# Patient Record
Sex: Male | Born: 1985 | Race: Black or African American | Hispanic: No | Marital: Single | Smoking: Former smoker
Health system: Southern US, Community
[De-identification: ages and names within clinical notes are randomized; demographics above are authoritative.]

## PROBLEM LIST (undated history)

## (undated) DIAGNOSIS — B20 Human immunodeficiency virus [HIV] disease: Secondary | ICD-10-CM

## (undated) DIAGNOSIS — H547 Unspecified visual loss: Secondary | ICD-10-CM

## (undated) DIAGNOSIS — L853 Xerosis cutis: Secondary | ICD-10-CM

## (undated) DIAGNOSIS — I10 Essential (primary) hypertension: Secondary | ICD-10-CM

## (undated) HISTORY — DX: Essential (primary) hypertension: I10

## (undated) HISTORY — DX: Human immunodeficiency virus (HIV) disease: B20

## (undated) HISTORY — DX: Xerosis cutis: L85.3

## (undated) HISTORY — DX: Unspecified visual loss: H54.7

---

## 2005-03-27 ENCOUNTER — Emergency Department (HOSPITAL_COMMUNITY): Admission: EM | Admit: 2005-03-27 | Discharge: 2005-03-27 | Payer: Self-pay | Admitting: Emergency Medicine

## 2007-02-23 IMAGING — CR DG FOOT COMPLETE 3+V*L*
3 series · 3 of 3 positions shown · non-contrast
Comparison: none

CLINICAL DATA: Stepped on Quintin Villacorta

[place] FOOT - 3  VIEW:

[t foot ap left]
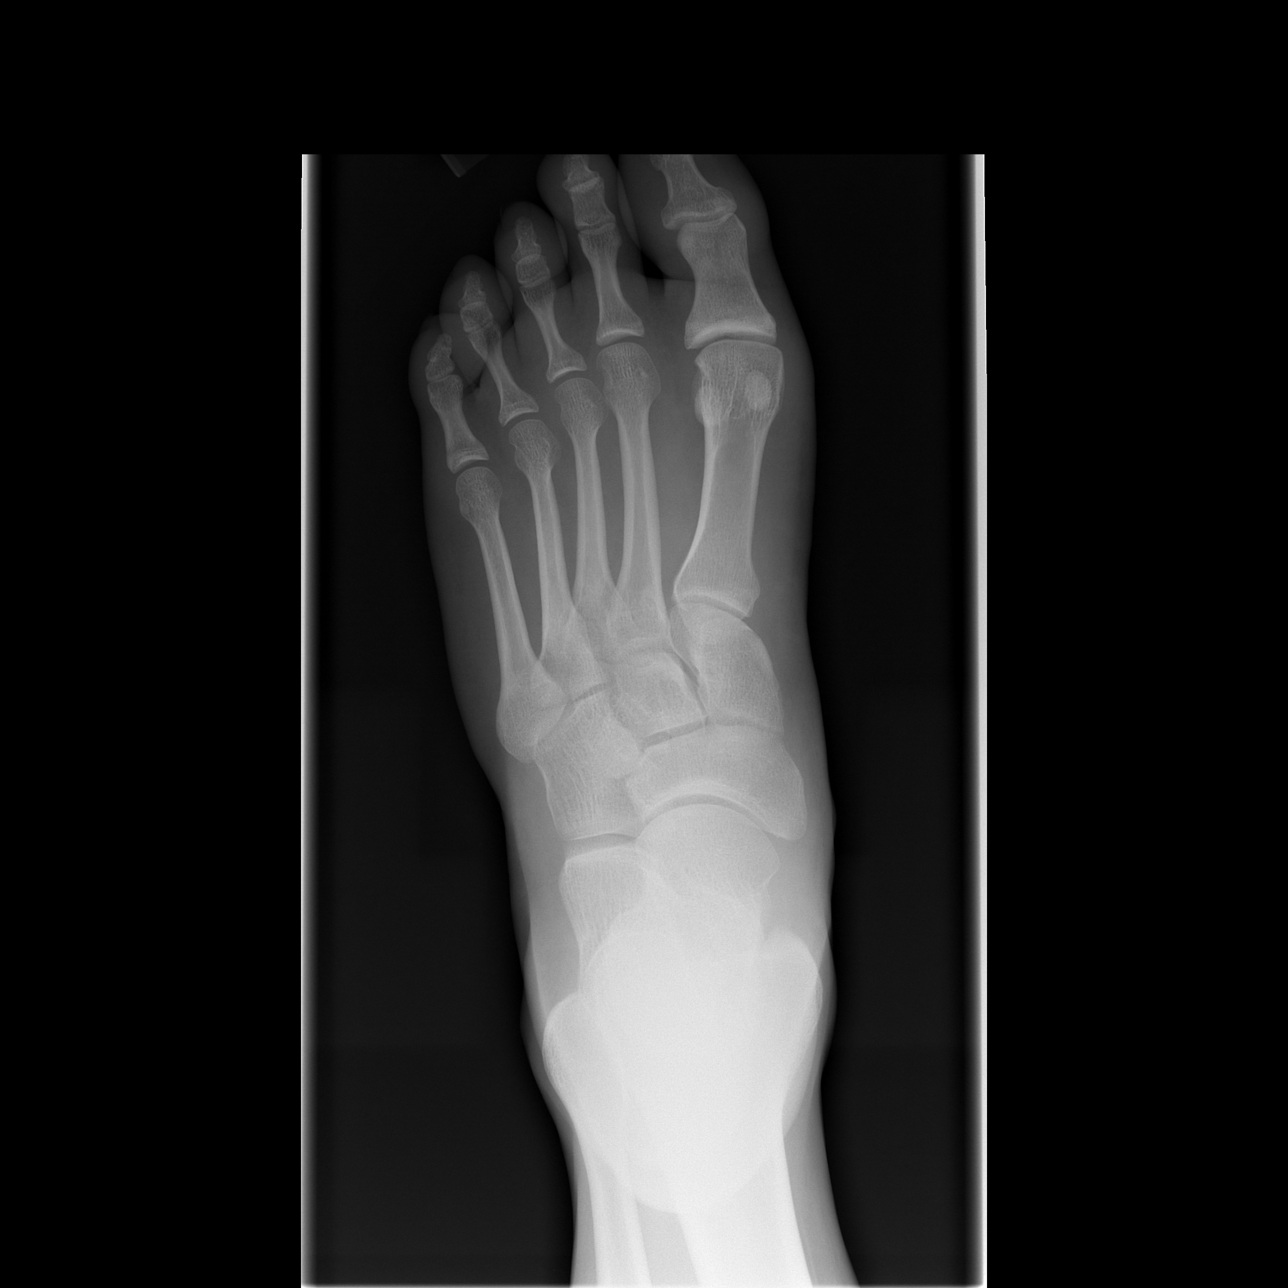

[t foot oblique left]
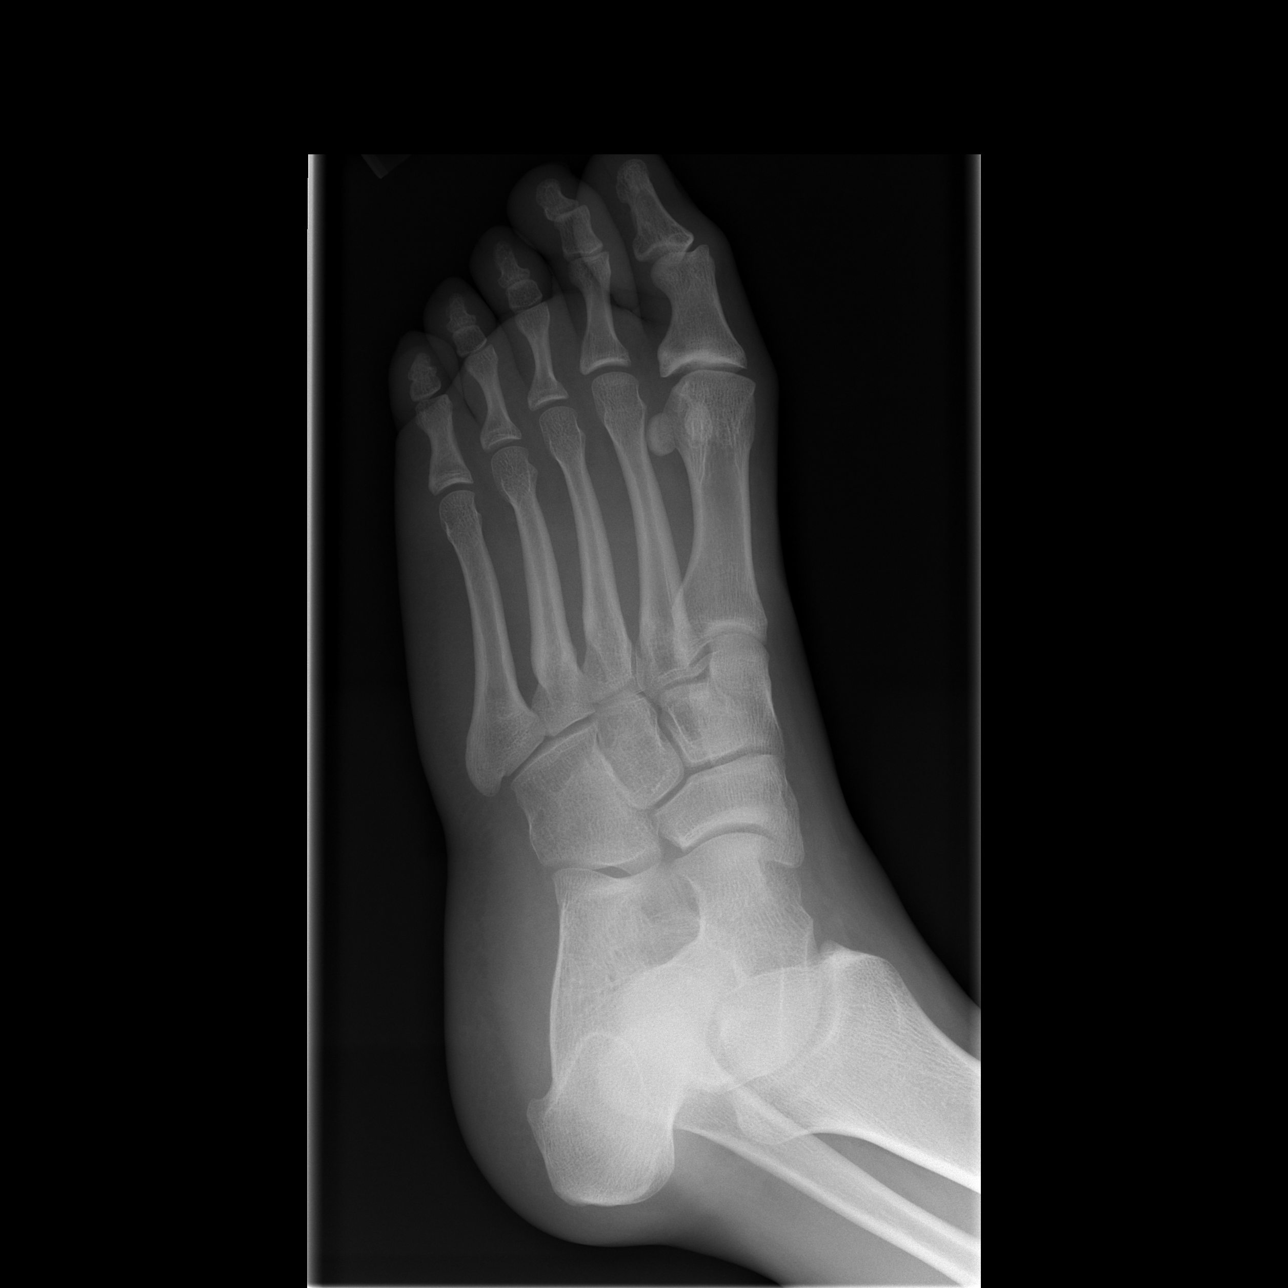

[t foot lat left]
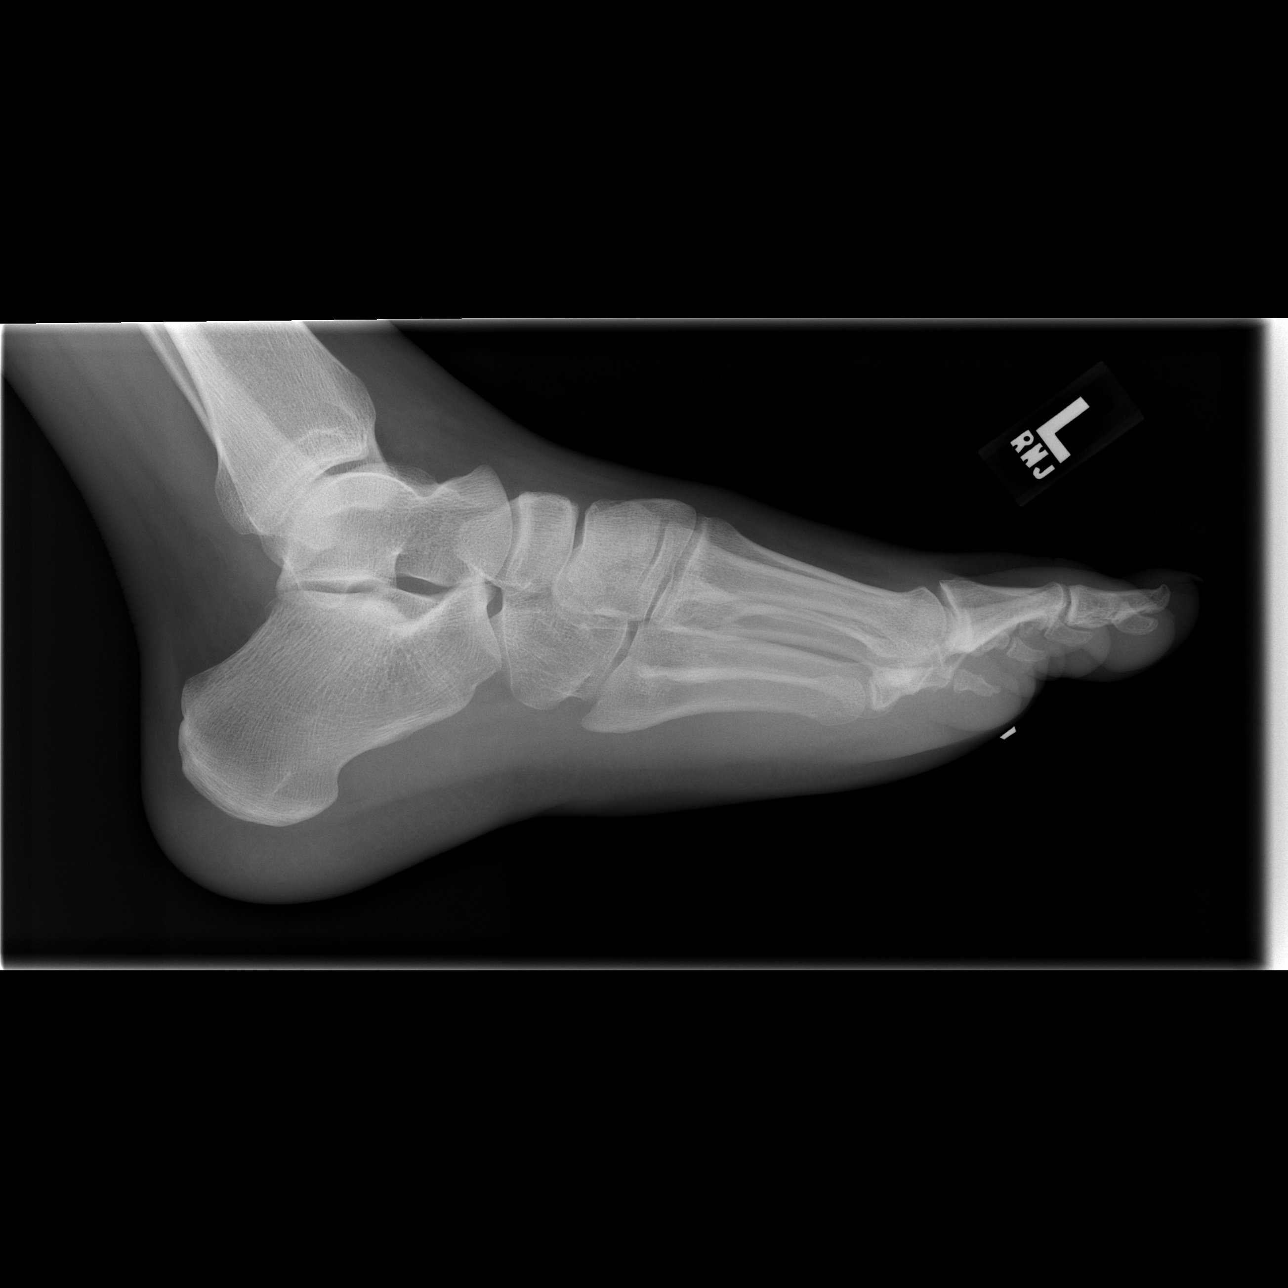

[3 of 3 positions shown; findings below may reference images not displayed]

FINDINGS: There is no evidence of fracture or dislocation.  There is no
evidence of arthropathy or other focal bone abnormality.  Soft tissues are
unremarkable.
IMPRESSION: Negative.

## 2014-06-05 ENCOUNTER — Telehealth: Payer: Self-pay

## 2014-06-05 NOTE — Telephone Encounter (Signed)
Patient contacted regarding new intake appointment. Date and time given. Information given regarding documents needed to qualify for financial eligibility.  Hazelene Doten K Jaree Trinka, RN  

## 2014-06-20 ENCOUNTER — Ambulatory Visit (INDEPENDENT_AMBULATORY_CARE_PROVIDER_SITE_OTHER): Payer: Self-pay

## 2014-06-20 DIAGNOSIS — Z21 Asymptomatic human immunodeficiency virus [HIV] infection status: Secondary | ICD-10-CM

## 2014-06-20 DIAGNOSIS — Z79899 Other long term (current) drug therapy: Secondary | ICD-10-CM

## 2014-06-20 DIAGNOSIS — F419 Anxiety disorder, unspecified: Secondary | ICD-10-CM

## 2014-06-20 DIAGNOSIS — B20 Human immunodeficiency virus [HIV] disease: Secondary | ICD-10-CM

## 2014-06-20 DIAGNOSIS — Z113 Encounter for screening for infections with a predominantly sexual mode of transmission: Secondary | ICD-10-CM

## 2014-06-20 LAB — URINALYSIS
Bilirubin Urine: NEGATIVE
GLUCOSE, UA: NEGATIVE mg/dL
Hgb urine dipstick: NEGATIVE
KETONES UR: NEGATIVE mg/dL
LEUKOCYTES UA: NEGATIVE
NITRITE: NEGATIVE
Protein, ur: NEGATIVE mg/dL
SPECIFIC GRAVITY, URINE: 1.026 (ref 1.005–1.030)
UROBILINOGEN UA: 1 mg/dL (ref 0.0–1.0)
pH: 7.5 (ref 5.0–8.0)

## 2014-06-21 DIAGNOSIS — Z23 Encounter for immunization: Secondary | ICD-10-CM

## 2014-06-21 DIAGNOSIS — Z21 Asymptomatic human immunodeficiency virus [HIV] infection status: Secondary | ICD-10-CM

## 2014-06-21 LAB — HEPATITIS B SURFACE ANTIBODY,QUALITATIVE: HEP B S AB: NEGATIVE

## 2014-06-21 LAB — CBC WITH DIFFERENTIAL/PLATELET
BASOS PCT: 0 % (ref 0–1)
Basophils Absolute: 0 10*3/uL (ref 0.0–0.1)
EOS ABS: 0.1 10*3/uL (ref 0.0–0.7)
EOS PCT: 1 % (ref 0–5)
HEMATOCRIT: 41.9 % (ref 39.0–52.0)
HEMOGLOBIN: 14.3 g/dL (ref 13.0–17.0)
LYMPHS ABS: 2.7 10*3/uL (ref 0.7–4.0)
LYMPHS PCT: 53 % — AB (ref 12–46)
MCH: 26.8 pg (ref 26.0–34.0)
MCHC: 34.1 g/dL (ref 30.0–36.0)
MCV: 78.6 fL (ref 78.0–100.0)
MPV: 10 fL (ref 9.4–12.4)
Monocytes Absolute: 0.5 10*3/uL (ref 0.1–1.0)
Monocytes Relative: 9 % (ref 3–12)
NEUTROS ABS: 1.9 10*3/uL (ref 1.7–7.7)
Neutrophils Relative %: 37 % — ABNORMAL LOW (ref 43–77)
PLATELETS: 204 10*3/uL (ref 150–400)
RBC: 5.33 MIL/uL (ref 4.22–5.81)
RDW: 13.7 % (ref 11.5–15.5)
WBC: 5.1 10*3/uL (ref 4.0–10.5)

## 2014-06-21 LAB — LIPID PANEL
CHOLESTEROL: 148 mg/dL (ref 0–200)
HDL: 34 mg/dL — ABNORMAL LOW (ref >39)
LDL Cholesterol: 79 mg/dL (ref 0–99)
TRIGLYCERIDES: 174 mg/dL — AB (ref <150)
Total CHOL/HDL Ratio: 4.4 Ratio
VLDL: 35 mg/dL (ref 0–40)

## 2014-06-21 LAB — COMPLETE METABOLIC PANEL WITH GFR
ALK PHOS: 65 U/L (ref 39–117)
ALT: 90 U/L — AB (ref 0–53)
AST: 54 U/L — AB (ref 0–37)
Albumin: 4.4 g/dL (ref 3.5–5.2)
BILIRUBIN TOTAL: 1 mg/dL (ref 0.2–1.2)
BUN: 11 mg/dL (ref 6–23)
CALCIUM: 9.5 mg/dL (ref 8.4–10.5)
CHLORIDE: 101 meq/L (ref 96–112)
CO2: 30 mEq/L (ref 19–32)
Creat: 0.95 mg/dL (ref 0.50–1.35)
GFR, Est Non African American: >89 mL/min
Glucose, Bld: 90 mg/dL (ref 70–99)
POTASSIUM: 4.4 meq/L (ref 3.5–5.3)
Sodium: 140 mEq/L (ref 135–145)
Total Protein: 7.7 g/dL (ref 6.0–8.3)

## 2014-06-21 LAB — T-HELPER CELL (CD4) - (RCID CLINIC ONLY)
CD4 % Helper T Cell: 22 % — ABNORMAL LOW (ref 33–55)
CD4 T CELL ABS: 610 /uL (ref 400–2700)

## 2014-06-21 LAB — HEPATITIS B SURFACE ANTIGEN: HEP B S AG: NEGATIVE

## 2014-06-21 LAB — HEPATITIS A ANTIBODY, TOTAL: Hep A Total Ab: NONREACTIVE

## 2014-06-21 LAB — URINE CYTOLOGY ANCILLARY ONLY
Chlamydia: NEGATIVE
Neisseria Gonorrhea: NEGATIVE

## 2014-06-21 LAB — RPR

## 2014-06-21 LAB — HEPATITIS B CORE ANTIBODY, TOTAL: Hep B Core Total Ab: NONREACTIVE

## 2014-06-21 LAB — HEPATITIS C ANTIBODY: HCV AB: NEGATIVE

## 2014-06-21 NOTE — Progress Notes (Signed)
Patient referred by DIS after testing positive for HIV on 04-24-14.  Patient states he was engaged in intercourse with a male as the receptive partner when the condom broke. He became very upset and his this person started crying and told him he was HIV positive. He offered the patient 4 blue pills which he identifies on the HIV drug chart as Truvada.  He took one of the tablets but did not take the others.   Patient is very upset and depressed. He has always been very careful and used condoms so this would not happen to him.   5 tattoos and bilateral ear piercings all which were done in shops.  Refused flu vaccine.  Pneumonia vaccine given. No medical records to request.   Laurell Josephsammy K Sojourner Behringer, RN

## 2014-06-23 LAB — QUANTIFERON TB GOLD ASSAY (BLOOD)
Interferon Gamma Release Assay: NEGATIVE
QUANTIFERON TB AG MINUS NIL: 0 [IU]/mL
Quantiferon Nil Value: 0.1 IU/mL
TB AG VALUE: 0.1 [IU]/mL

## 2014-06-23 LAB — HIV-1 RNA ULTRAQUANT REFLEX TO GENTYP+
HIV 1 RNA QUANT: 214489 {copies}/mL — AB (ref <20)
HIV-1 RNA Quant, Log: 5.33 {Log} — ABNORMAL HIGH (ref <1.30)

## 2014-06-28 LAB — HLA B*5701: HLA-B*5701 w/rflx HLA-B High: NEGATIVE

## 2014-07-03 LAB — HIV-1 GENOTYPR PLUS

## 2014-07-04 ENCOUNTER — Ambulatory Visit (INDEPENDENT_AMBULATORY_CARE_PROVIDER_SITE_OTHER): Payer: Self-pay | Admitting: Infectious Disease

## 2014-07-04 ENCOUNTER — Encounter: Payer: Self-pay | Admitting: Infectious Disease

## 2014-07-04 ENCOUNTER — Ambulatory Visit (INDEPENDENT_AMBULATORY_CARE_PROVIDER_SITE_OTHER): Payer: Self-pay | Admitting: Licensed Clinical Social Worker

## 2014-07-04 VITALS — BP 144/93 | HR 93 | Temp 98.6°F | Ht 73.0 in | Wt 227.0 lb

## 2014-07-04 DIAGNOSIS — Z720 Tobacco use: Secondary | ICD-10-CM

## 2014-07-04 DIAGNOSIS — F17200 Nicotine dependence, unspecified, uncomplicated: Secondary | ICD-10-CM | POA: Insufficient documentation

## 2014-07-04 DIAGNOSIS — Z23 Encounter for immunization: Secondary | ICD-10-CM

## 2014-07-04 DIAGNOSIS — B20 Human immunodeficiency virus [HIV] disease: Secondary | ICD-10-CM | POA: Insufficient documentation

## 2014-07-04 DIAGNOSIS — I10 Essential (primary) hypertension: Secondary | ICD-10-CM | POA: Insufficient documentation

## 2014-07-04 MED ORDER — ABACAVIR-DOLUTEGRAVIR-LAMIVUD 600-50-300 MG PO TABS: 1.0000 | ORAL_TABLET | Freq: Every day | ORAL | Status: DC

## 2014-07-04 NOTE — Progress Notes (Signed)
   Subjective:    Patient ID: Cole CastleGregory Carr, male    DOB: 1985-11-13, 28 y.o.   MRN: [id number]  HPI  28 year old African-American male with newly diced diagnosed HIV infection. He states that he was diagnosed HIV when he asked for testing while he was in jail. He tells me that he tested negative for HIV approximately a month ago. He did have unprotected sexual intercourse with another man who apparently had HIV and who gave the patient a Truvada pill to take after they have had sex.  Cole Carr himself unfortunately did contract HIV and unfortunately does not have resistance since seen on his genotype.  We had an extensive discussion with regards to different antiretroviral regimens in the and opted to start the patient on TRIUMEQ.  Review of Systems  Constitutional: Negative for fever, chills, diaphoresis, activity change, appetite change, fatigue and unexpected weight change.  HENT: Negative for congestion, rhinorrhea, sinus pressure, sneezing, sore throat and trouble swallowing.   Eyes: Negative for photophobia and visual disturbance.  Respiratory: Negative for cough, chest tightness, shortness of breath, wheezing and stridor.   Cardiovascular: Negative for chest pain, palpitations and leg swelling.  Gastrointestinal: Negative for nausea, vomiting, abdominal pain, diarrhea, constipation, blood in stool, abdominal distention and anal bleeding.  Genitourinary: Negative for dysuria, hematuria, flank pain and difficulty urinating.  Musculoskeletal: Negative for myalgias, back pain, joint swelling, arthralgias and gait problem.  Skin: Negative for color change, pallor, rash and wound.  Neurological: Negative for dizziness, tremors, weakness and light-headedness.  Hematological: Negative for adenopathy. Does not bruise/bleed easily.  Psychiatric/Behavioral: Negative for behavioral problems, confusion, sleep disturbance, dysphoric mood, decreased concentration and agitation.         Objective:   Physical Exam  Constitutional: He is oriented to person, place, and time. He appears well-developed and well-nourished.  HENT:  Head: Normocephalic and atraumatic.  Eyes: Conjunctivae and EOM are normal.  Neck: Normal range of motion. Neck supple.  Cardiovascular: Normal rate and regular rhythm.   Pulmonary/Chest: Effort normal. No respiratory distress. He has no wheezes.  Abdominal: Soft. He exhibits no distension.  Musculoskeletal: Normal range of motion. He exhibits no edema or tenderness.  Neurological: He is alert and oriented to person, place, and time.  Skin: Skin is warm and dry. No rash noted. No erythema. No pallor.          Assessment & Plan:   HIV: Start TRIUMEQ recheck viral load and CD4 count in one month's time. He had an exceedingly high viral load counseled him to avoid sex and use condoms if he has any sexual intercourse as he is highly contagious this point in time. We will vaccinate him for hepatitis A and B as well as given flu shot today. I spent greater than 60 minutes with the patient including greater than 50% of time in face to face counsel of the patient and in coordination of their care.  Smoker: work on smoking cessation next visit  Hypertension: Could be whitecoat hypertension recheck blood pressure next visit.

## 2014-07-11 ENCOUNTER — Other Ambulatory Visit: Payer: Self-pay | Admitting: *Deleted

## 2014-07-11 DIAGNOSIS — B20 Human immunodeficiency virus [HIV] disease: Secondary | ICD-10-CM

## 2014-07-11 MED ORDER — ABACAVIR-DOLUTEGRAVIR-LAMIVUD 600-50-300 MG PO TABS: 1.0000 | ORAL_TABLET | Freq: Every day | ORAL | Status: DC

## 2014-07-11 NOTE — Telephone Encounter (Signed)
ADAP Application 

## 2014-08-01 ENCOUNTER — Other Ambulatory Visit (INDEPENDENT_AMBULATORY_CARE_PROVIDER_SITE_OTHER): Payer: Self-pay

## 2014-08-01 DIAGNOSIS — Z113 Encounter for screening for infections with a predominantly sexual mode of transmission: Secondary | ICD-10-CM

## 2014-08-01 DIAGNOSIS — B20 Human immunodeficiency virus [HIV] disease: Secondary | ICD-10-CM

## 2014-08-01 LAB — COMPLETE METABOLIC PANEL WITH GFR
ALT: 33 U/L (ref 0–53)
AST: 34 U/L (ref 0–37)
Albumin: 4.7 g/dL (ref 3.5–5.2)
Alkaline Phosphatase: 46 U/L (ref 39–117)
BILIRUBIN TOTAL: 1.4 mg/dL — AB (ref 0.2–1.2)
BUN: 9 mg/dL (ref 6–23)
CHLORIDE: 104 meq/L (ref 96–112)
CO2: 29 mEq/L (ref 19–32)
Calcium: 9.5 mg/dL (ref 8.4–10.5)
Creat: 1.1 mg/dL (ref 0.50–1.35)
GFR, Est Non African American: >89 mL/min
Glucose, Bld: 91 mg/dL (ref 70–99)
POTASSIUM: 3.9 meq/L (ref 3.5–5.3)
SODIUM: 141 meq/L (ref 135–145)
TOTAL PROTEIN: 7.4 g/dL (ref 6.0–8.3)

## 2014-08-01 LAB — CBC WITH DIFFERENTIAL/PLATELET
BASOS ABS: 0 10*3/uL (ref 0.0–0.1)
Basophils Relative: 0 % (ref 0–1)
Eosinophils Absolute: 0 10*3/uL (ref 0.0–0.7)
Eosinophils Relative: 1 % (ref 0–5)
HEMATOCRIT: 42.4 % (ref 39.0–52.0)
HEMOGLOBIN: 13.8 g/dL (ref 13.0–17.0)
LYMPHS PCT: 56 % — AB (ref 12–46)
Lymphs Abs: 2.2 10*3/uL (ref 0.7–4.0)
MCH: 26.2 pg (ref 26.0–34.0)
MCHC: 32.5 g/dL (ref 30.0–36.0)
MCV: 80.5 fL (ref 78.0–100.0)
MONO ABS: 0.4 10*3/uL (ref 0.1–1.0)
MONOS PCT: 10 % (ref 3–12)
MPV: 9.8 fL (ref 8.6–12.4)
NEUTROS ABS: 1.3 10*3/uL — AB (ref 1.7–7.7)
Neutrophils Relative %: 33 % — ABNORMAL LOW (ref 43–77)
PLATELETS: 214 10*3/uL (ref 150–400)
RBC: 5.27 MIL/uL (ref 4.22–5.81)
RDW: 15.2 % (ref 11.5–15.5)
WBC: 3.9 10*3/uL — AB (ref 4.0–10.5)

## 2014-08-02 LAB — RPR

## 2014-08-03 LAB — HIV-1 RNA QUANT-NO REFLEX-BLD
HIV 1 RNA QUANT: 103 {copies}/mL — AB (ref <20)
HIV-1 RNA QUANT, LOG: 2.01 {Log} — AB (ref <1.30)

## 2014-08-03 LAB — T-HELPER CELL (CD4) - (RCID CLINIC ONLY)
CD4 % Helper T Cell: 28 % — ABNORMAL LOW (ref 33–55)
CD4 T Cell Abs: 750 /uL (ref 400–2700)

## 2014-08-15 ENCOUNTER — Ambulatory Visit: Payer: Self-pay | Admitting: Infectious Disease

## 2014-09-14 ENCOUNTER — Ambulatory Visit: Payer: Self-pay | Admitting: Infectious Disease

## 2014-09-14 ENCOUNTER — Ambulatory Visit: Payer: Self-pay

## 2015-05-10 ENCOUNTER — Encounter: Payer: Self-pay | Admitting: Infectious Disease

## 2015-05-10 ENCOUNTER — Ambulatory Visit (INDEPENDENT_AMBULATORY_CARE_PROVIDER_SITE_OTHER): Payer: Self-pay | Admitting: Infectious Disease

## 2015-05-10 VITALS — BP 142/97 | HR 98 | Temp 98.3°F | Wt 238.0 lb

## 2015-05-10 DIAGNOSIS — B20 Human immunodeficiency virus [HIV] disease: Secondary | ICD-10-CM

## 2015-05-10 DIAGNOSIS — Z720 Tobacco use: Secondary | ICD-10-CM

## 2015-05-10 DIAGNOSIS — F17200 Nicotine dependence, unspecified, uncomplicated: Secondary | ICD-10-CM

## 2015-05-10 DIAGNOSIS — I10 Essential (primary) hypertension: Secondary | ICD-10-CM

## 2015-05-10 NOTE — Progress Notes (Signed)
Subjective:    Patient ID: Cole Carr, male    DOB: September 08, 1985, 29 y.o.   MRN: [id number]  HPI   29 year old African-American male with relatively recently  diagnosed HIV infection.   We started him on  TRIUMEQ and he has had decent and fairly rapid virological control. He renewed ADAP in Spring but did not see me. He failed to renew this fall and has been without medications for 5 days. Prior to this the most he misses is 0-4 per month. I asked him to tighten this adherence.  We will have him do ADAP paperwork today and [facility]  Past Medical History  Diagnosis Date  . HIV infection (HCC)   . Hypertension     History reviewed. No pertinent past surgical history.  History reviewed. No pertinent family history.    Social History   Social History  . Marital Status: Single    Spouse Name: N/A  . Number of Children: N/A  . Years of Education: N/A   Social History Main Topics  . Smoking status: Current Every Day Smoker -- 0.50 packs/day    Types: Cigarettes    Start date: 01/25/2014  . Smokeless tobacco: Never Used  . Alcohol Use: 1.2 oz/week    2 Shots of liquor per week  . Drug Use: 3.00 per week    Special: Marijuana  . Sexual Activity:    Partners: Female, Male    Birth Control/ Protection: Condom   Other Topics Concern  . None   Social History Narrative    No Known Allergies   Current outpatient prescriptions:  .  Abacavir-Dolutegravir-Lamivud (TRIUMEQ) 600-50-300 MG TABS, Take 1 tablet by mouth daily., Disp: 30 tablet, Rfl: 11    Review of Systems  Constitutional: Negative for fever, chills, diaphoresis, activity change, appetite change, fatigue and unexpected weight change.  HENT: Negative for congestion, rhinorrhea, sinus pressure, sneezing, sore throat and trouble swallowing.   Eyes: Negative for photophobia and visual disturbance.  Respiratory: Negative for cough, chest tightness, shortness of breath, wheezing and  stridor.   Cardiovascular: Negative for chest pain, palpitations and leg swelling.  Gastrointestinal: Negative for nausea, vomiting, abdominal pain, diarrhea, constipation, blood in stool, abdominal distention and anal bleeding.  Genitourinary: Negative for dysuria, hematuria, flank pain and difficulty urinating.  Musculoskeletal: Negative for myalgias, back pain, joint swelling, arthralgias and gait problem.  Skin: Negative for color change, pallor, rash and wound.  Neurological: Negative for dizziness, tremors, weakness and light-headedness.  Hematological: Negative for adenopathy. Does not bruise/bleed easily.  Psychiatric/Behavioral: Negative for behavioral problems, confusion, sleep disturbance, dysphoric mood, decreased concentration and agitation.       Objective:   Physical Exam  Constitutional: He is oriented to person, place, and time. He appears well-developed and well-nourished.  HENT:  Head: Normocephalic and atraumatic.  Eyes: Conjunctivae and EOM are normal.  Neck: Normal range of motion. Neck supple.  Cardiovascular: Normal rate and regular rhythm.   Pulmonary/Chest: Effort normal. No respiratory distress. He has no wheezes.  Abdominal: Soft. He exhibits no distension.  Musculoskeletal: Normal range of motion. He exhibits no edema or tenderness.  Neurological: He is alert and oriented to person, place, and time.  Skin: Skin is warm and dry. No rash noted. No erythema. No pallor.  Psychiatric: He has a normal mood and affect. His behavior is normal. Judgment and thought content normal.  Nursing note and vitals reviewed.         Assessment &  Plan:   HIV: has been on  TRIUMEQ. Will get him onto [facility] on Monday, ADAP being done today. Will recheck labs one month after he has been back on meds  Smoker: counseled to at minimum go to e cigarettes  Hypertension: He is going to see if he can improve his HTN by losing weight. I suspect he is going to need an  anti-HTSive  I spent greater than 25 minutes with the patient including greater than 50% of time in face to face counsel of the patient re his HIV, his HTN, his smoking and in coordination of his care.

## 2015-05-14 ENCOUNTER — Other Ambulatory Visit: Payer: Self-pay

## 2015-06-19 NOTE — Progress Notes (Signed)
Notified [facility] via fax. Kiev Labrosse M, RN 

## 2015-07-11 ENCOUNTER — Other Ambulatory Visit: Payer: Self-pay | Admitting: Infectious Disease

## 2015-07-11 DIAGNOSIS — B20 Human immunodeficiency virus [HIV] disease: Secondary | ICD-10-CM

## 2015-09-10 ENCOUNTER — Ambulatory Visit: Payer: Self-pay

## 2015-10-03 ENCOUNTER — Other Ambulatory Visit: Payer: Self-pay

## 2015-10-03 DIAGNOSIS — Z79899 Other long term (current) drug therapy: Secondary | ICD-10-CM

## 2015-10-03 DIAGNOSIS — Z113 Encounter for screening for infections with a predominantly sexual mode of transmission: Secondary | ICD-10-CM

## 2015-10-03 DIAGNOSIS — B20 Human immunodeficiency virus [HIV] disease: Secondary | ICD-10-CM

## 2015-10-04 ENCOUNTER — Other Ambulatory Visit: Payer: Self-pay

## 2015-11-05 ENCOUNTER — Other Ambulatory Visit (INDEPENDENT_AMBULATORY_CARE_PROVIDER_SITE_OTHER): Payer: Self-pay

## 2015-11-05 DIAGNOSIS — B20 Human immunodeficiency virus [HIV] disease: Secondary | ICD-10-CM

## 2015-11-05 DIAGNOSIS — Z79899 Other long term (current) drug therapy: Secondary | ICD-10-CM

## 2015-11-05 DIAGNOSIS — Z113 Encounter for screening for infections with a predominantly sexual mode of transmission: Secondary | ICD-10-CM

## 2015-11-05 LAB — CBC WITH DIFFERENTIAL/PLATELET
BASOS ABS: 64 {cells}/uL (ref 0–200)
Basophils Relative: 1 %
EOS ABS: 64 {cells}/uL (ref 15–500)
EOS PCT: 1 %
HCT: 41.9 % (ref 38.5–50.0)
HEMOGLOBIN: 14 g/dL (ref 13.2–17.1)
LYMPHS ABS: 3456 {cells}/uL (ref 850–3900)
Lymphocytes Relative: 54 %
MCH: 28.8 pg (ref 27.0–33.0)
MCHC: 33.4 g/dL (ref 32.0–36.0)
MCV: 86.2 fL (ref 80.0–100.0)
MPV: 9.5 fL (ref 7.5–12.5)
Monocytes Absolute: 512 cells/uL (ref 200–950)
Monocytes Relative: 8 %
NEUTROS ABS: 2304 {cells}/uL (ref 1500–7800)
Neutrophils Relative %: 36 %
Platelets: 256 10*3/uL (ref 140–400)
RBC: 4.86 MIL/uL (ref 4.20–5.80)
RDW: 14.3 % (ref 11.0–15.0)
WBC: 6.4 10*3/uL (ref 3.8–10.8)

## 2015-11-06 LAB — LIPID PANEL
CHOL/HDL RATIO: 4.4 ratio (ref <=5.0)
Cholesterol: 190 mg/dL (ref 125–200)
HDL: 43 mg/dL (ref >=40)
LDL CALC: 128 mg/dL (ref <130)
TRIGLYCERIDES: 96 mg/dL (ref <150)
VLDL: 19 mg/dL (ref <30)

## 2015-11-06 LAB — COMPREHENSIVE METABOLIC PANEL
ALK PHOS: 53 U/L (ref 40–115)
ALT: 86 U/L — ABNORMAL HIGH (ref 9–46)
AST: 66 U/L — ABNORMAL HIGH (ref 10–40)
Albumin: 4.7 g/dL (ref 3.6–5.1)
BUN: 10 mg/dL (ref 7–25)
CALCIUM: 9.6 mg/dL (ref 8.6–10.3)
CO2: 27 mmol/L (ref 20–31)
Chloride: 101 mmol/L (ref 98–110)
Creat: 1.16 mg/dL (ref 0.60–1.35)
GLUCOSE: 106 mg/dL — AB (ref 65–99)
POTASSIUM: 4 mmol/L (ref 3.5–5.3)
Sodium: 140 mmol/L (ref 135–146)
Total Bilirubin: 1.4 mg/dL — ABNORMAL HIGH (ref 0.2–1.2)
Total Protein: 7.6 g/dL (ref 6.1–8.1)

## 2015-11-06 LAB — HIV-1 RNA ULTRAQUANT REFLEX TO GENTYP+
HIV 1 RNA Quant: 78 copies/mL — ABNORMAL HIGH (ref <20)
HIV-1 RNA QUANT, LOG: 1.89 {Log_copies}/mL — AB (ref <1.30)

## 2015-11-06 LAB — RPR

## 2015-11-07 LAB — URINE CYTOLOGY ANCILLARY ONLY
Chlamydia: NEGATIVE
NEISSERIA GONORRHEA: NEGATIVE

## 2015-11-22 ENCOUNTER — Telehealth: Payer: Self-pay | Admitting: *Deleted

## 2015-11-22 ENCOUNTER — Other Ambulatory Visit: Payer: Self-pay | Admitting: *Deleted

## 2015-11-22 ENCOUNTER — Other Ambulatory Visit: Payer: Self-pay | Admitting: Infectious Disease

## 2015-11-22 DIAGNOSIS — Z21 Asymptomatic human immunodeficiency virus [HIV] infection status: Secondary | ICD-10-CM

## 2015-11-22 DIAGNOSIS — B20 Human immunodeficiency virus [HIV] disease: Secondary | ICD-10-CM

## 2015-11-22 MED ORDER — ABACAVIR-DOLUTEGRAVIR-LAMIVUD 600-50-300 MG PO TABS: 1.0000 | ORAL_TABLET | Freq: Every day | ORAL | Status: DC

## 2015-11-22 NOTE — Telephone Encounter (Signed)
Patient calling for lab results, please advise. For some reason it appears that a CD4 was not done and he does not have a follow up MD appt. Dr. Zenaida NieceVan Dam's next available is middle of June.  Cole Carr

## 2015-11-22 NOTE — Telephone Encounter (Signed)
That was probably an error. The MORE important test was done which was the VL which was at 6578 which is likely a viral blip.

## 2015-11-23 NOTE — Telephone Encounter (Signed)
Ok good to know ADAP requirement silly in an established pt

## 2015-11-23 NOTE — Telephone Encounter (Signed)
He needs the CD4 for Marcelino DusterMichelle to be able to submit his ADAP and he is going to come in for that. He was informed of his viral load.

## 2015-11-29 ENCOUNTER — Telehealth: Payer: Self-pay | Admitting: *Deleted

## 2015-11-29 NOTE — Telephone Encounter (Signed)
[facility]. RN left message notifying patient it is here for pick up. Patient overdue for an appointment with Dr. Daiva EvesVan Dam. RN left generic message asking him to schedule follow up asap. He was supposed to follow up in November 2016. [name], [name], RN

## 2016-02-04 ENCOUNTER — Encounter: Payer: Self-pay | Admitting: Infectious Disease

## 2016-06-19 ENCOUNTER — Other Ambulatory Visit: Payer: Self-pay | Admitting: Infectious Disease

## 2016-06-19 DIAGNOSIS — B20 Human immunodeficiency virus [HIV] disease: Secondary | ICD-10-CM

## 2016-07-01 ENCOUNTER — Telehealth: Payer: Self-pay | Admitting: *Deleted

## 2016-07-01 NOTE — Telephone Encounter (Signed)
Pt in need of labs/appointment. RN attempted to call, call disconnected twice.  [name], Maleah Rabago M, RN

## 2016-07-05 ENCOUNTER — Other Ambulatory Visit: Payer: Self-pay | Admitting: Infectious Disease

## 2016-07-05 DIAGNOSIS — B20 Human immunodeficiency virus [HIV] disease: Secondary | ICD-10-CM

## 2016-08-21 ENCOUNTER — Other Ambulatory Visit: Payer: Self-pay | Admitting: Infectious Disease

## 2016-08-21 DIAGNOSIS — B20 Human immunodeficiency virus [HIV] disease: Secondary | ICD-10-CM

## 2016-10-04 ENCOUNTER — Other Ambulatory Visit: Payer: Self-pay | Admitting: Infectious Disease

## 2016-10-04 DIAGNOSIS — B20 Human immunodeficiency virus [HIV] disease: Secondary | ICD-10-CM

## 2016-10-09 ENCOUNTER — Telehealth: Payer: Self-pay | Admitting: *Deleted

## 2016-10-09 NOTE — Telephone Encounter (Signed)
Patient called requesting refill on Triumeq stating he has been getting it consistently and just took his last dose on 10/04/16. Our system shows we have been denying it since 05/2016 because patient's last MD visit was 04/2015. Called [facility] in [place] where patient said he was picking it up and they stated they last filled it 08/21/16 (they said this was a system error). He had also gotten refills every month since August 2017. He has an appointment for ADAP tomorrow. If he keeps this appointment I will send in a 30 day supply to [facility] with one refill until his MD appt 11/19/16.

## 2016-10-10 ENCOUNTER — Other Ambulatory Visit: Payer: Self-pay | Admitting: *Deleted

## 2016-10-10 ENCOUNTER — Ambulatory Visit: Payer: Self-pay

## 2016-10-10 DIAGNOSIS — B20 Human immunodeficiency virus [HIV] disease: Secondary | ICD-10-CM

## 2016-10-10 MED ORDER — ABACAVIR-DOLUTEGRAVIR-LAMIVUD 600-50-300 MG PO TABS: 1.0000 | ORAL_TABLET | Freq: Every day | ORAL | 1 refills | Status: DC

## 2016-10-10 NOTE — Telephone Encounter (Signed)
Rx sent 

## 2016-10-13 ENCOUNTER — Encounter: Payer: Self-pay | Admitting: Infectious Disease

## 2016-11-19 ENCOUNTER — Ambulatory Visit (INDEPENDENT_AMBULATORY_CARE_PROVIDER_SITE_OTHER): Payer: Self-pay | Admitting: Infectious Disease

## 2016-11-19 ENCOUNTER — Encounter: Payer: Self-pay | Admitting: Infectious Disease

## 2016-11-19 VITALS — BP 146/101 | HR 99 | Temp 97.4°F | Wt 232.0 lb

## 2016-11-19 DIAGNOSIS — Z113 Encounter for screening for infections with a predominantly sexual mode of transmission: Secondary | ICD-10-CM

## 2016-11-19 DIAGNOSIS — I10 Essential (primary) hypertension: Secondary | ICD-10-CM

## 2016-11-19 DIAGNOSIS — B20 Human immunodeficiency virus [HIV] disease: Secondary | ICD-10-CM

## 2016-11-19 DIAGNOSIS — F17200 Nicotine dependence, unspecified, uncomplicated: Secondary | ICD-10-CM

## 2016-11-19 DIAGNOSIS — Z23 Encounter for immunization: Secondary | ICD-10-CM

## 2016-11-19 LAB — COMPREHENSIVE METABOLIC PANEL
AG RATIO: 1.7 ratio (ref 1.0–2.5)
ALBUMIN: 4.7 g/dL (ref 3.6–5.1)
ALT: 141 U/L — ABNORMAL HIGH (ref 9–46)
AST: 146 U/L — ABNORMAL HIGH (ref 10–40)
Alkaline Phosphatase: 52 U/L (ref 40–115)
BILIRUBIN TOTAL: 1.2 mg/dL (ref 0.2–1.2)
BUN/Creatinine Ratio: 8.5 Ratio (ref 6–22)
BUN: 12 mg/dL (ref 7–25)
CO2: 27 mmol/L (ref 20–31)
Calcium: 9.6 mg/dL (ref 8.6–10.3)
Chloride: 100 mmol/L (ref 98–110)
Creat: 1.42 mg/dL — ABNORMAL HIGH (ref 0.60–1.35)
GFR, EST NON AFRICAN AMERICAN: 66 mL/min (ref >=60)
GFR, Est African American: 76 mL/min (ref >=60)
GLOBULIN: 2.8 g/dL (ref 1.9–3.7)
Glucose, Bld: 134 mg/dL — ABNORMAL HIGH (ref 65–99)
POTASSIUM: 3.4 mmol/L — AB (ref 3.5–5.3)
SODIUM: 139 mmol/L (ref 135–146)
Total Protein: 7.5 g/dL (ref 6.1–8.1)

## 2016-11-19 LAB — COMPLETE METABOLIC PANEL WITH GFR
AG RATIO: 1.7 ratio (ref 1.0–2.5)
ALK PHOS: 52 U/L (ref 40–115)
ALT: 141 U/L — AB (ref 9–46)
AST: 146 U/L — AB (ref 10–40)
Albumin: 4.7 g/dL (ref 3.6–5.1)
BILIRUBIN TOTAL: 1.2 mg/dL (ref 0.2–1.2)
BUN / CREAT RATIO: 8.5 ratio (ref 6–22)
BUN: 12 mg/dL (ref 7–25)
CO2: 27 mmol/L (ref 20–31)
CREATININE: 1.42 mg/dL — AB (ref 0.60–1.35)
Calcium: 9.6 mg/dL (ref 8.6–10.3)
Chloride: 100 mmol/L (ref 98–110)
GFR, EST AFRICAN AMERICAN: 76 mL/min (ref >=60)
GFR, EST NON AFRICAN AMERICAN: 66 mL/min (ref >=60)
GLOBULIN: 2.8 g/dL (ref 1.9–3.7)
Glucose, Bld: 134 mg/dL — ABNORMAL HIGH (ref 65–99)
Potassium: 3.4 mmol/L — ABNORMAL LOW (ref 3.5–5.3)
Sodium: 139 mmol/L (ref 135–146)
Total Protein: 7.5 g/dL (ref 6.1–8.1)

## 2016-11-19 LAB — CBC WITH DIFFERENTIAL/PLATELET
BASOS PCT: 0 %
Basophils Absolute: 0 cells/uL (ref 0–200)
EOS PCT: 0 %
Eosinophils Absolute: 0 cells/uL — ABNORMAL LOW (ref 15–500)
HCT: 44.6 % (ref 38.5–50.0)
Hemoglobin: 14.8 g/dL (ref 13.2–17.1)
LYMPHS PCT: 46 %
Lymphs Abs: 3404 cells/uL (ref 850–3900)
MCH: 28 pg (ref 27.0–33.0)
MCHC: 33.2 g/dL (ref 32.0–36.0)
MCV: 84.5 fL (ref 80.0–100.0)
MONOS PCT: 5 %
MPV: 10.2 fL (ref 7.5–12.5)
Monocytes Absolute: 370 cells/uL (ref 200–950)
NEUTROS ABS: 3626 {cells}/uL (ref 1500–7800)
Neutrophils Relative %: 49 %
PLATELETS: 252 10*3/uL (ref 140–400)
RBC: 5.28 MIL/uL (ref 4.20–5.80)
RDW: 14 % (ref 11.0–15.0)
WBC: 7.4 10*3/uL (ref 3.8–10.8)

## 2016-11-19 MED ORDER — ABACAVIR-DOLUTEGRAVIR-LAMIVUD 600-50-300 MG PO TABS: 1.0000 | ORAL_TABLET | Freq: Every day | ORAL | 11 refills | Status: DC

## 2016-11-19 NOTE — Progress Notes (Signed)
Subjective:    Patient ID: Cole Carr, male    DOB: [birth year], 31 y.o.   MRN: [id number]  HPI  58 -year-old African-American male with relatively recently  diagnosed HIV infection.   We started him on  TRIUMEQ and he has had decent and fairly rapid virological control. He renewed ADAP in Spring but did not see me. He failed to renew following falll in 2016 and has been without medications for 5 days. Prior to this the most he missedis 0-4 per month. I asked him to tighten this adherence.  We will have him do ADAP paperwork today and come back on Monday for [facility] in 2016.  He claims that he has been consistently on meds and that he saw me in November. ADAP appears to have been renewed and again in Spring but I cannot find any documentaiotn of visits with [place] or even labs since 2017 in the Spring.   Lab Results  Component Value Date   HIV1RNAQUANT 78 (H) 11/05/2015   HIV1RNAQUANT 103 (H) 08/01/2014   HIV1RNAQUANT 214,489 (H) 06/20/2014   Lab Results  Component Value Date   CD4TABS 750 08/01/2014   CD4TABS 610 06/20/2014     Past Medical History:  Diagnosis Date  . HIV infection (HCC)   . Hypertension     No past surgical history on file.  Family History  Problem Relation Age of Onset  . Hyperlipidemia Mother       Social History   Social History  . Marital status: Single    Spouse name: N/A  . Number of children: N/A  . Years of education: N/A   Social History Main Topics  . Smoking status: Current Every Day Smoker    Packs/day: 0.50    Types: Cigarettes    Start date: 01/25/2014  . Smokeless tobacco: Never Used  . Alcohol use 1.2 oz/week    2 Shots of liquor per week  . Drug use: Yes    Frequency: 3.0 times per week    Types: Marijuana  . Sexual activity: Yes    Partners: Female, Male    Birth control/ protection: Condom   Other Topics Concern  . None   Social History Narrative  . None    No Known Allergies   Current Outpatient  Prescriptions:  .  abacavir-dolutegravir-lamiVUDine (TRIUMEQ) 600-50-300 MG tablet, Take 1 tablet by mouth daily., Disp: 30 tablet, Rfl: 1    Review of Systems  Constitutional: Negative for activity change, appetite change, chills, diaphoresis, fatigue, fever and unexpected weight change.  HENT: Negative for congestion, rhinorrhea, sinus pressure, sneezing, sore throat and trouble swallowing.   Eyes: Negative for photophobia and visual disturbance.  Respiratory: Negative for cough, chest tightness, shortness of breath, wheezing and stridor.   Cardiovascular: Negative for chest pain, palpitations and leg swelling.  Gastrointestinal: Negative for abdominal distention, abdominal pain, anal bleeding, blood in stool, constipation, diarrhea, nausea and vomiting.  Genitourinary: Negative for difficulty urinating, dysuria, flank pain and hematuria.  Musculoskeletal: Negative for arthralgias, back pain, gait problem and joint swelling.  Skin: Negative for color change, pallor, rash and wound.  Neurological: Negative for dizziness, tremors, weakness and light-headedness.  Hematological: Negative for adenopathy. Does not bruise/bleed easily.  Psychiatric/Behavioral: Negative for agitation, behavioral problems, confusion, decreased concentration, dysphoric mood and sleep disturbance.       Objective:   Physical Exam  Constitutional: He is oriented to person, place, and time. He appears well-developed and well-nourished.  HENT:  Head: Normocephalic and  atraumatic.  Mouth/Throat: No oropharyngeal exudate.  Eyes: Conjunctivae and EOM are normal.  Neck: Normal range of motion. Neck supple.  Cardiovascular: Normal rate and regular rhythm.   Pulmonary/Chest: Effort normal. No respiratory distress. He has no wheezes.  Abdominal: Soft. He exhibits no distension.  Musculoskeletal: Normal range of motion. He exhibits no edema or tenderness.  Neurological: He is alert and oriented to person, place, and  time.  Skin: Skin is warm and dry. No rash noted. No erythema. No pallor.  Psychiatric: He has a normal mood and affect. His behavior is normal. Judgment and thought content normal.  Nursing note and vitals reviewed.         Assessment & Plan:   HIV: ADAP renewed. Get labs today and RTC for labs and ADAP in July and to see me in August. Continue   TRIUMEQ. Will get him onto [facility] on Monday, ADAP being done today.   Vaccinate for meningocccus   Hypertension: he is going to need a medicine for his BP Vitals:   11/19/16 1622  BP: (!) 146/101  Pulse: 99  Temp: 97.4 F (36.3 C)    I spent greater than 25 minutes with the patient including greater than 50% of time in face to face counsel of the patient re his HIV,and in coordination of his care.

## 2016-11-20 LAB — RPR

## 2016-11-20 LAB — MICROALBUMIN / CREATININE URINE RATIO
CREATININE, URINE: 444 mg/dL — AB (ref 20–370)
MICROALB UR: 2.4 mg/dL
Microalb Creat Ratio: 5 mcg/mg creat (ref <30)

## 2016-11-20 NOTE — Addendum Note (Signed)
Addended by: Alesia Morin F on: 11/20/2016 11:14 AM   Modules accepted: Orders

## 2016-11-21 LAB — URINE CYTOLOGY ANCILLARY ONLY
Chlamydia: NEGATIVE
NEISSERIA GONORRHEA: NEGATIVE

## 2016-11-21 LAB — T-HELPER CELL (CD4) - (RCID CLINIC ONLY)
CD4 % Helper T Cell: 34 % (ref 33–55)
CD4 T Cell Abs: 1250 /uL (ref 400–2700)

## 2016-11-25 LAB — HIV RNA, RTPCR W/R GT (RTI, PI,INT)
HIV-1 RNA, QN PCR: NOT DETECTED {Log_copies}/mL
HIV-1 RNA, QN PCR: NOT DETECTED {copies}/mL

## 2017-03-11 ENCOUNTER — Ambulatory Visit: Payer: Self-pay | Admitting: Infectious Disease

## 2017-03-17 ENCOUNTER — Encounter: Payer: Self-pay | Admitting: Infectious Disease

## 2017-03-17 ENCOUNTER — Ambulatory Visit (INDEPENDENT_AMBULATORY_CARE_PROVIDER_SITE_OTHER): Payer: Self-pay | Admitting: Infectious Disease

## 2017-03-17 VITALS — BP 150/101 | HR 96 | Temp 98.5°F | Wt 227.0 lb

## 2017-03-17 DIAGNOSIS — Z113 Encounter for screening for infections with a predominantly sexual mode of transmission: Secondary | ICD-10-CM

## 2017-03-17 DIAGNOSIS — Z79899 Other long term (current) drug therapy: Secondary | ICD-10-CM

## 2017-03-17 DIAGNOSIS — L853 Xerosis cutis: Secondary | ICD-10-CM

## 2017-03-17 DIAGNOSIS — B20 Human immunodeficiency virus [HIV] disease: Secondary | ICD-10-CM

## 2017-03-17 DIAGNOSIS — I10 Essential (primary) hypertension: Secondary | ICD-10-CM

## 2017-03-17 DIAGNOSIS — H547 Unspecified visual loss: Secondary | ICD-10-CM

## 2017-03-17 HISTORY — DX: Xerosis cutis: L85.3

## 2017-03-17 HISTORY — DX: Unspecified visual loss: H54.7

## 2017-03-17 LAB — CBC WITH DIFFERENTIAL/PLATELET
BASOS ABS: 0 {cells}/uL (ref 0–200)
Basophils Relative: 0 %
Eosinophils Absolute: 79 cells/uL (ref 15–500)
Eosinophils Relative: 1 %
HEMATOCRIT: 44.1 % (ref 38.5–50.0)
HEMOGLOBIN: 14.7 g/dL (ref 13.2–17.1)
LYMPHS ABS: 3871 {cells}/uL (ref 850–3900)
LYMPHS PCT: 49 %
MCH: 28.5 pg (ref 27.0–33.0)
MCHC: 33.3 g/dL (ref 32.0–36.0)
MCV: 85.6 fL (ref 80.0–100.0)
MONO ABS: 395 {cells}/uL (ref 200–950)
MPV: 9.5 fL (ref 7.5–12.5)
Monocytes Relative: 5 %
NEUTROS PCT: 45 %
Neutro Abs: 3555 cells/uL (ref 1500–7800)
Platelets: 301 10*3/uL (ref 140–400)
RBC: 5.15 MIL/uL (ref 4.20–5.80)
RDW: 13.2 % (ref 11.0–15.0)
WBC: 7.9 10*3/uL (ref 3.8–10.8)

## 2017-03-17 MED ORDER — HYDROCHLOROTHIAZIDE 25 MG PO TABS: 25.0000 mg | ORAL_TABLET | Freq: Every day | ORAL | 11 refills | Status: DC

## 2017-03-17 MED ORDER — ABACAVIR-DOLUTEGRAVIR-LAMIVUD 600-50-300 MG PO TABS: 1.0000 | ORAL_TABLET | Freq: Every day | ORAL | 11 refills | Status: DC

## 2017-03-17 NOTE — Progress Notes (Signed)
Subjective:    Chief complaint: His noticed some dryness rise eyes for last several years he also has noticed recent decrease in his visual acuity   Patient ID: Cole Carr, male    DOB: 19-Apr-1986, 31 y.o.   MRN: [id number]  HPI  31 year old African-American male with relatively recently  diagnosed HIV infection.   We started him on  TRIUMEQ and he has had decent and fairly rapid virological control. He renewed ADAP in Spring but did not see me. He failed to renew following falll in 2016 and has been without medications for 5 days. Prior to this the most he missedis 0-4 per month. I asked him to tighten this adherence.  We will have him do ADAP paperwork today and come back on Monday for [facility] in 2016.  He claims that he has been consistently on meds and that he saw me in November. ADAP appears to have been renewed and again in Spring but I cannot find any documentaiotn of visits with [place] or even labs since 2017 in the Spring then seen again in April of 2018.  VL still <20.  He has no some dryness rise eyes and not sure if this is attributed potentially to his TRIUMEQ though I doubt it. He also has been taking anterior mean for weight loss. Blood pressure is elevated he does need to be treated for elevated blood pressure has noticed reduced visual acuity as well as requesting referral to optometry       Lab Results  Component Value Date   HIV1RNAQUANT 78 (H) 11/05/2015   HIV1RNAQUANT 103 (H) 08/01/2014   HIV1RNAQUANT 214,489 (H) 06/20/2014   Lab Results  Component Value Date   CD4TABS 1,250 11/19/2016   CD4TABS 750 08/01/2014   CD4TABS 610 06/20/2014     Past Medical History:  Diagnosis Date  . Dry skin 03/17/2017  . HIV infection (HCC)   . Hypertension     No past surgical history on file.  Family History  Problem Relation Age of Onset  . Hyperlipidemia Mother       Social History   Social History  . Marital status: Single    Spouse name: N/A  .  Number of children: N/A  . Years of education: N/A   Social History Main Topics  . Smoking status: Current Every Day Smoker    Packs/day: 0.50    Types: Cigarettes    Start date: 01/25/2014  . Smokeless tobacco: Never Used  . Alcohol use 1.2 oz/week    2 Shots of liquor per week  . Drug use: Yes    Frequency: 3.0 times per week    Types: Marijuana  . Sexual activity: Yes    Partners: Female, Male    Birth control/ protection: Condom   Other Topics Concern  . Not on file   Social History Narrative  . No narrative on file    No Known Allergies   Current Outpatient Prescriptions:  .  abacavir-dolutegravir-lamiVUDine (TRIUMEQ) 600-50-300 MG tablet, Take 1 tablet by mouth daily., Disp: 30 tablet, Rfl: 11 .  hydrochlorothiazide (HYDRODIURIL) 25 MG tablet, Take 1 tablet (25 mg total) by mouth daily., Disp: 30 tablet, Rfl: 11    Review of Systems  Constitutional: Negative for activity change, appetite change, chills, diaphoresis, fatigue, fever and unexpected weight change.  HENT: Negative for congestion, rhinorrhea, sinus pressure, sneezing, sore throat and trouble swallowing.   Eyes: Positive for visual disturbance. Negative for photophobia.  Respiratory: Negative for cough, chest  tightness, shortness of breath, wheezing and stridor.   Cardiovascular: Negative for chest pain, palpitations and leg swelling.  Gastrointestinal: Negative for abdominal distention, abdominal pain, anal bleeding, blood in stool, constipation, diarrhea, nausea and vomiting.  Genitourinary: Negative for difficulty urinating, dysuria, flank pain and hematuria.  Musculoskeletal: Negative for arthralgias, back pain, gait problem and joint swelling.  Skin: Negative for color change, pallor, rash and wound.       Dry skin around eyes  Neurological: Negative for dizziness, tremors, weakness and light-headedness.  Hematological: Negative for adenopathy. Does not bruise/bleed easily.  Psychiatric/Behavioral:  Negative for agitation, behavioral problems, confusion, decreased concentration, dysphoric mood and sleep disturbance.       Objective:   Physical Exam  Constitutional: He is oriented to person, place, and time. He appears well-developed and well-nourished.  HENT:  Head: Normocephalic and atraumatic.  Mouth/Throat: No oropharyngeal exudate.  Eyes: Conjunctivae and EOM are normal. Right eye exhibits no discharge. Left eye exhibits no discharge. No scleral icterus.  Neck: Normal range of motion. Neck supple.  Cardiovascular: Normal rate and regular rhythm.   Pulmonary/Chest: Effort normal. No respiratory distress. He has no wheezes.  Abdominal: Soft. He exhibits no distension.  Musculoskeletal: Normal range of motion. He exhibits no edema or tenderness.  Neurological: He is alert and oriented to person, place, and time.  Skin: Skin is warm and dry. No rash noted. No erythema. No pallor.  Psychiatric: He has a normal mood and affect. His behavior is normal. Judgment and thought content normal.  Nursing note and vitals reviewed.         Assessment & Plan:   HIV: ADAP renewed. Continue   TRIUMEQ., check labs, ensure ADAP renewal   Hypertension: add HCTZ. He was willing to be established with care in the internal medicine clinic though he will be driving from [place]. Vitals:   03/17/17 1433  BP: (!) 150/101  Pulse: 96  Temp: 98.5 F (36.9 C)  SpO2: 100%   Dry skin around eyes: Not sure if this is due to his phentermine I doubt is due to [facility].  Decreased visual acuity brought this up after the visit was over when he was ensuring a gap renewal. I'll try to get him plugged into optometry office.  Overweight: phentermine may be contributing to high BP    I spent greater than 25 minutes with the patient including greater than 50% of time in face to face counsel of the patient re his HIV hypertension dry skin around the eyes use of the tear mean decreased visual acuity,and  in coordination of his care.

## 2017-03-18 LAB — LIPID PANEL
Cholesterol: 174 mg/dL (ref <200)
HDL: 40 mg/dL — ABNORMAL LOW (ref >40)
LDL CALC: 108 mg/dL — AB (ref <100)
Total CHOL/HDL Ratio: 4.4 Ratio (ref <5.0)
Triglycerides: 132 mg/dL (ref <150)
VLDL: 26 mg/dL (ref <30)

## 2017-03-18 LAB — COMPLETE METABOLIC PANEL WITH GFR
ALBUMIN: 4.6 g/dL (ref 3.6–5.1)
ALT: 80 U/L — AB (ref 9–46)
AST: 35 U/L (ref 10–40)
Alkaline Phosphatase: 72 U/L (ref 40–115)
BUN: 5 mg/dL — AB (ref 7–25)
CALCIUM: 9.9 mg/dL (ref 8.6–10.3)
CHLORIDE: 102 mmol/L (ref 98–110)
CO2: 24 mmol/L (ref 20–32)
CREATININE: 1.09 mg/dL (ref 0.60–1.35)
GFR, Est African American: >89 mL/min (ref >=60)
GFR, Est Non African American: >89 mL/min (ref >=60)
GLUCOSE: 153 mg/dL — AB (ref 65–99)
POTASSIUM: 4.5 mmol/L (ref 3.5–5.3)
SODIUM: 141 mmol/L (ref 135–146)
Total Bilirubin: 0.6 mg/dL (ref 0.2–1.2)
Total Protein: 7.4 g/dL (ref 6.1–8.1)

## 2017-03-18 LAB — HEPATITIS A ANTIBODY, TOTAL: HEP A TOTAL AB: NONREACTIVE

## 2017-03-18 LAB — T-HELPER CELL (CD4) - (RCID CLINIC ONLY)
CD4 T CELL HELPER: 32 % — AB (ref 33–55)
CD4 T Cell Abs: 1290 /uL (ref 400–2700)

## 2017-03-18 LAB — HEPATITIS B SURFACE ANTIBODY,QUALITATIVE: HEP B S AB: NONREACTIVE

## 2017-03-18 LAB — RPR

## 2017-03-19 ENCOUNTER — Encounter: Payer: Self-pay | Admitting: Infectious Disease

## 2017-03-20 LAB — HIV-1 RNA QUANT-NO REFLEX-BLD
HIV 1 RNA QUANT: DETECTED {copies}/mL — AB
HIV-1 RNA Quant, Log: <1.3 Log copies/mL — AB

## 2017-05-22 ENCOUNTER — Encounter: Payer: Self-pay | Admitting: Internal Medicine

## 2017-08-04 ENCOUNTER — Other Ambulatory Visit: Payer: Self-pay

## 2017-08-04 ENCOUNTER — Ambulatory Visit: Payer: Self-pay

## 2017-08-17 ENCOUNTER — Ambulatory Visit: Payer: Self-pay | Admitting: Infectious Disease

## 2017-08-18 ENCOUNTER — Ambulatory Visit: Payer: Self-pay | Admitting: Infectious Disease

## 2017-10-01 NOTE — Addendum Note (Signed)
Addended by: Robinette Esters F on: 10/01/2017 11:02 AM   Modules accepted: Orders  

## 2017-10-01 NOTE — Addendum Note (Signed)
Addended by: Alesia MorinPOOLE, Donjuan Robison F on: 10/01/2017 11:02 AM   Modules accepted: Orders

## 2017-10-01 NOTE — Addendum Note (Signed)
Addended by: POOLE, TRAVIS F on: 10/01/2017 11:02 AM   Modules accepted: Orders  

## 2017-10-05 ENCOUNTER — Other Ambulatory Visit: Payer: Self-pay

## 2017-10-05 ENCOUNTER — Encounter: Payer: Self-pay | Admitting: Infectious Disease

## 2017-10-05 DIAGNOSIS — B20 Human immunodeficiency virus [HIV] disease: Secondary | ICD-10-CM

## 2017-10-06 LAB — CBC WITH DIFFERENTIAL/PLATELET
BASOS PCT: 0.7 %
Basophils Absolute: 43 cells/uL (ref 0–200)
EOS PCT: 1.5 %
Eosinophils Absolute: 92 cells/uL (ref 15–500)
HCT: 41.5 % (ref 38.5–50.0)
HEMOGLOBIN: 14.1 g/dL (ref 13.2–17.1)
LYMPHS ABS: 3294 {cells}/uL (ref 850–3900)
MCH: 28.3 pg (ref 27.0–33.0)
MCHC: 34 g/dL (ref 32.0–36.0)
MCV: 83.2 fL (ref 80.0–100.0)
MPV: 10.4 fL (ref 7.5–12.5)
Monocytes Relative: 8.6 %
NEUTROS ABS: 2147 {cells}/uL (ref 1500–7800)
NEUTROS PCT: 35.2 %
Platelets: 241 10*3/uL (ref 140–400)
RBC: 4.99 10*6/uL (ref 4.20–5.80)
RDW: 12.7 % (ref 11.0–15.0)
Total Lymphocyte: 54 %
WBC: 6.1 10*3/uL (ref 3.8–10.8)
WBCMIX: 525 {cells}/uL (ref 200–950)

## 2017-10-06 LAB — COMPLETE METABOLIC PANEL WITH GFR
AG Ratio: 1.8 (calc) (ref 1.0–2.5)
ALT: 69 U/L — ABNORMAL HIGH (ref 9–46)
AST: 41 U/L — AB (ref 10–40)
Albumin: 4.7 g/dL (ref 3.6–5.1)
Alkaline phosphatase (APISO): 49 U/L (ref 40–115)
BUN: 12 mg/dL (ref 7–25)
CALCIUM: 10.1 mg/dL (ref 8.6–10.3)
CO2: 30 mmol/L (ref 20–32)
CREATININE: 1.13 mg/dL (ref 0.60–1.35)
Chloride: 100 mmol/L (ref 98–110)
GFR, EST AFRICAN AMERICAN: 100 mL/min/{1.73_m2} (ref >=60)
GFR, EST NON AFRICAN AMERICAN: 86 mL/min/{1.73_m2} (ref >=60)
GLOBULIN: 2.6 g/dL (ref 1.9–3.7)
Glucose, Bld: 150 mg/dL — ABNORMAL HIGH (ref 65–99)
Potassium: 3.9 mmol/L (ref 3.5–5.3)
Sodium: 138 mmol/L (ref 135–146)
TOTAL PROTEIN: 7.3 g/dL (ref 6.1–8.1)
Total Bilirubin: 1.2 mg/dL (ref 0.2–1.2)

## 2017-10-06 LAB — RPR: RPR Ser Ql: NONREACTIVE

## 2017-10-06 LAB — T-HELPER CELL (CD4) - (RCID CLINIC ONLY)
CD4 % Helper T Cell: 37 % (ref 33–55)
CD4 T Cell Abs: 1520 /uL (ref 400–2700)

## 2017-10-07 LAB — HIV-1 RNA QUANT-NO REFLEX-BLD
HIV 1 RNA Quant: <20 copies/mL — AB
HIV-1 RNA Quant, Log: <1.3 Log copies/mL — AB

## 2017-10-19 ENCOUNTER — Ambulatory Visit: Payer: Self-pay | Admitting: Infectious Disease

## 2018-03-18 ENCOUNTER — Other Ambulatory Visit: Payer: Self-pay | Admitting: Infectious Disease

## 2018-03-18 DIAGNOSIS — Z79899 Other long term (current) drug therapy: Secondary | ICD-10-CM

## 2018-03-18 DIAGNOSIS — B20 Human immunodeficiency virus [HIV] disease: Secondary | ICD-10-CM

## 2018-03-18 DIAGNOSIS — Z113 Encounter for screening for infections with a predominantly sexual mode of transmission: Secondary | ICD-10-CM

## 2018-03-18 DIAGNOSIS — I10 Essential (primary) hypertension: Secondary | ICD-10-CM

## 2018-03-31 ENCOUNTER — Other Ambulatory Visit: Payer: Self-pay

## 2018-03-31 ENCOUNTER — Other Ambulatory Visit: Payer: Self-pay | Admitting: *Deleted

## 2018-03-31 DIAGNOSIS — B20 Human immunodeficiency virus [HIV] disease: Secondary | ICD-10-CM

## 2018-04-07 ENCOUNTER — Other Ambulatory Visit: Payer: Self-pay

## 2018-04-07 DIAGNOSIS — B20 Human immunodeficiency virus [HIV] disease: Secondary | ICD-10-CM

## 2018-04-08 LAB — T-HELPER CELL (CD4) - (RCID CLINIC ONLY)
CD4 % Helper T Cell: 39 % (ref 33–55)
CD4 T Cell Abs: 1120 /uL (ref 400–2700)

## 2018-04-09 LAB — CBC WITH DIFFERENTIAL/PLATELET
BASOS PCT: 0.4 %
Basophils Absolute: 21 cells/uL (ref 0–200)
EOS ABS: 80 {cells}/uL (ref 15–500)
Eosinophils Relative: 1.5 %
HEMATOCRIT: 42.8 % (ref 38.5–50.0)
Hemoglobin: 14.4 g/dL (ref 13.2–17.1)
LYMPHS ABS: 2836 {cells}/uL (ref 850–3900)
MCH: 27.7 pg (ref 27.0–33.0)
MCHC: 33.6 g/dL (ref 32.0–36.0)
MCV: 82.5 fL (ref 80.0–100.0)
MPV: 10.6 fL (ref 7.5–12.5)
Monocytes Relative: 6.8 %
Neutro Abs: 2003 cells/uL (ref 1500–7800)
Neutrophils Relative %: 37.8 %
Platelets: 222 10*3/uL (ref 140–400)
RBC: 5.19 10*6/uL (ref 4.20–5.80)
RDW: 12.7 % (ref 11.0–15.0)
Total Lymphocyte: 53.5 %
WBC mixed population: 360 cells/uL (ref 200–950)
WBC: 5.3 10*3/uL (ref 3.8–10.8)

## 2018-04-09 LAB — COMPLETE METABOLIC PANEL WITH GFR
AG Ratio: 2 (calc) (ref 1.0–2.5)
ALBUMIN MSPROF: 4.7 g/dL (ref 3.6–5.1)
ALKALINE PHOSPHATASE (APISO): 59 U/L (ref 40–115)
ALT: 111 U/L — ABNORMAL HIGH (ref 9–46)
AST: 81 U/L — AB (ref 10–40)
BILIRUBIN TOTAL: 0.9 mg/dL (ref 0.2–1.2)
BUN: 12 mg/dL (ref 7–25)
CHLORIDE: 101 mmol/L (ref 98–110)
CO2: 29 mmol/L (ref 20–32)
CREATININE: 1.07 mg/dL (ref 0.60–1.35)
Calcium: 9.7 mg/dL (ref 8.6–10.3)
GFR, Est African American: 106 mL/min/{1.73_m2} (ref >=60)
GFR, Est Non African American: 91 mL/min/{1.73_m2} (ref >=60)
GLOBULIN: 2.3 g/dL (ref 1.9–3.7)
GLUCOSE: 219 mg/dL — AB (ref 65–99)
Potassium: 3.8 mmol/L (ref 3.5–5.3)
SODIUM: 137 mmol/L (ref 135–146)
Total Protein: 7 g/dL (ref 6.1–8.1)

## 2018-04-09 LAB — HIV-1 RNA QUANT-NO REFLEX-BLD
HIV 1 RNA Quant: <20 copies/mL
HIV-1 RNA Quant, Log: <1.3 Log copies/mL

## 2018-04-14 ENCOUNTER — Encounter: Payer: Self-pay | Admitting: Infectious Disease

## 2018-04-15 ENCOUNTER — Encounter: Payer: Self-pay | Admitting: Infectious Disease

## 2018-04-19 ENCOUNTER — Other Ambulatory Visit: Payer: Self-pay | Admitting: Infectious Disease

## 2018-04-19 DIAGNOSIS — I10 Essential (primary) hypertension: Secondary | ICD-10-CM

## 2018-04-19 DIAGNOSIS — B20 Human immunodeficiency virus [HIV] disease: Secondary | ICD-10-CM

## 2018-04-19 DIAGNOSIS — Z79899 Other long term (current) drug therapy: Secondary | ICD-10-CM

## 2018-04-19 DIAGNOSIS — Z113 Encounter for screening for infections with a predominantly sexual mode of transmission: Secondary | ICD-10-CM

## 2018-06-07 ENCOUNTER — Ambulatory Visit: Payer: Self-pay | Admitting: Infectious Disease

## 2018-07-05 ENCOUNTER — Encounter: Payer: Self-pay | Admitting: Infectious Disease

## 2018-07-05 ENCOUNTER — Ambulatory Visit (INDEPENDENT_AMBULATORY_CARE_PROVIDER_SITE_OTHER): Payer: Self-pay | Admitting: Infectious Disease

## 2018-07-05 VITALS — BP 148/96 | HR 86 | Temp 98.3°F | Wt 236.0 lb

## 2018-07-05 DIAGNOSIS — Z21 Asymptomatic human immunodeficiency virus [HIV] infection status: Secondary | ICD-10-CM

## 2018-07-05 DIAGNOSIS — I10 Essential (primary) hypertension: Secondary | ICD-10-CM

## 2018-07-05 DIAGNOSIS — B20 Human immunodeficiency virus [HIV] disease: Secondary | ICD-10-CM

## 2018-07-05 DIAGNOSIS — Z23 Encounter for immunization: Secondary | ICD-10-CM

## 2018-07-05 DIAGNOSIS — F17200 Nicotine dependence, unspecified, uncomplicated: Secondary | ICD-10-CM

## 2018-07-05 MED ORDER — VARENICLINE TARTRATE 0.5 MG X 11 & 1 MG X 42 PO MISC: ORAL | 0 refills | Status: DC

## 2018-07-05 MED ORDER — VARENICLINE TARTRATE 1 MG PO TABS: 1.0000 mg | ORAL_TABLET | Freq: Two times a day (BID) | ORAL | 1 refills | Status: DC

## 2018-07-05 MED ORDER — ABACAVIR-DOLUTEGRAVIR-LAMIVUD 600-50-300 MG PO TABS: 1.0000 | ORAL_TABLET | Freq: Every day | ORAL | 11 refills | Status: DC

## 2018-07-05 NOTE — Progress Notes (Signed)
Subjective:    Chief complaint:    Patient ID: Cole Carr, male    DOB: 04-Feb-1986, 32 y.o.   MRN: [id number]  HPI  32 year old African-American male with relatively recently  diagnosed HIV infection who has been on Triumeq.  He is a smoker and is interested in quitting smoking.  I will therefore give him a Chantix prescription.  Also raised the issue of abacavir and the controversy surrounding cardiovascular risk with this nucleotide reverse transcriptase inhibitor  We discussed switching to potentially Biktarvy or Dovato.   Past Medical History:  Diagnosis Date  . Dry skin 03/17/2017  . HIV infection (HCC)   . Hypertension   . Visual acuity reduced 03/17/2017    No past surgical history on file.  Family History  Problem Relation Age of Onset  . Hyperlipidemia Mother       Social History   Socioeconomic History  . Marital status: Single    Spouse name: Not on file  . Number of children: Not on file  . Years of education: Not on file  . Highest education level: Not on file  Occupational History  . Not on file  Social Needs  . Financial resource strain: Not on file  . Food insecurity:    Worry: Not on file    Inability: Not on file  . Transportation needs:    Medical: Not on file    Non-medical: Not on file  Tobacco Use  . Smoking status: Current Every Day Smoker    Packs/day: 0.50    Types: Cigarettes    Start date: 01/25/2014  . Smokeless tobacco: Never Used  Substance and Sexual Activity  . Alcohol use: Yes    Alcohol/week: 2.0 standard drinks    Types: 2 Shots of liquor per week  . Drug use: Yes    Frequency: 3.0 times per week    Types: Marijuana  . Sexual activity: Yes    Partners: Female, Male    Birth control/protection: Condom  Lifestyle  . Physical activity:    Days per week: Not on file    Minutes per session: Not on file  . Stress: Not on file  Relationships  . Social connections:    Talks on phone: Not on file    Gets  together: Not on file    Attends religious service: Not on file    Active member of club or organization: Not on file    Attends meetings of clubs or organizations: Not on file    Relationship status: Not on file  Other Topics Concern  . Not on file  Social History Narrative  . Not on file    No Known Allergies   Current Outpatient Medications:  .  hydrochlorothiazide (HYDRODIURIL) 25 MG tablet, TAKE 1 TABLET(25 MG) BY MOUTH DAILY, Disp: 30 tablet, Rfl: 1 .  TRIUMEQ 600-50-300 MG tablet, TAKE 1 TABLET BY MOUTH DAILY, Disp: 30 tablet, Rfl: 1    Review of Systems  Constitutional: Negative for activity change, appetite change, chills, diaphoresis, fatigue, fever and unexpected weight change.  HENT: Negative for congestion, rhinorrhea, sinus pressure, sneezing, sore throat and trouble swallowing.   Eyes: Negative for photophobia and visual disturbance.  Respiratory: Negative for cough, chest tightness, shortness of breath, wheezing and stridor.   Cardiovascular: Negative for chest pain, palpitations and leg swelling.  Gastrointestinal: Negative for abdominal distention, abdominal pain, anal bleeding, blood in stool, constipation, diarrhea, nausea and vomiting.  Genitourinary: Negative for difficulty urinating, dysuria, flank pain and  hematuria.  Musculoskeletal: Negative for arthralgias, back pain, gait problem and joint swelling.  Skin: Negative for color change, pallor, rash and wound.       Dry skin around eyes  Neurological: Negative for dizziness, tremors, weakness and light-headedness.  Hematological: Negative for adenopathy. Does not bruise/bleed easily.  Psychiatric/Behavioral: Negative for agitation, behavioral problems, confusion, decreased concentration, dysphoric mood and sleep disturbance.       Objective:   Physical Exam  Constitutional: He is oriented to person, place, and time. He appears well-developed and well-nourished.  HENT:  Head: Normocephalic and  atraumatic.  Mouth/Throat: No oropharyngeal exudate.  Eyes: Conjunctivae and EOM are normal. Right eye exhibits no discharge. Left eye exhibits no discharge. No scleral icterus.  Neck: Normal range of motion. Neck supple.  Cardiovascular: Normal rate and regular rhythm.  Pulmonary/Chest: Effort normal. No respiratory distress. He has no wheezes.  Abdominal: Soft. He exhibits no distension.  Musculoskeletal: Normal range of motion. He exhibits no edema or tenderness.  Neurological: He is alert and oriented to person, place, and time.  Skin: Skin is warm and dry. No rash noted. No erythema. No pallor.  Psychiatric: He has a normal mood and affect. His behavior is normal. Judgment and thought content normal.  Nursing note and vitals reviewed.         Assessment & Plan:   HIV:  Continue   TRIUMEQ RENEW HMAP IN January with Olegario Messier.  Gust switching to [employer] versus Dovato to get the abacavir out  Hypertension: continue diuretic  There were no vitals filed for this visit.   Smoking we will prescribe Chantix  Overweight: New efforts with diet.  Consider getting him off antegrade strand transfer inhibitor though I am reluctant given he tolerates it very well   I spent greater than 25 minutes with the patient including greater than 50% of time in face to face counsel of the patient regarding risks associated with smoking and need to stop smoking data with surrounding abacavir and controversy as to whether has any correlation with cardiovascular risks other options he could switch to including Dovato and Biktarvy, regards needs to influenza vaccine and hep B vaccine and in and in coordination of his care.

## 2018-07-29 NOTE — Addendum Note (Signed)
Addended by: Lurlean Leyden on: 07/29/2018 04:39 PM   Modules accepted: Orders

## 2018-08-09 ENCOUNTER — Ambulatory Visit: Payer: Self-pay

## 2018-10-31 ENCOUNTER — Other Ambulatory Visit: Payer: Self-pay | Admitting: Infectious Disease

## 2018-10-31 DIAGNOSIS — B20 Human immunodeficiency virus [HIV] disease: Secondary | ICD-10-CM

## 2018-10-31 DIAGNOSIS — I10 Essential (primary) hypertension: Secondary | ICD-10-CM

## 2018-10-31 DIAGNOSIS — Z79899 Other long term (current) drug therapy: Secondary | ICD-10-CM

## 2018-10-31 DIAGNOSIS — Z113 Encounter for screening for infections with a predominantly sexual mode of transmission: Secondary | ICD-10-CM

## 2019-01-04 ENCOUNTER — Ambulatory Visit (INDEPENDENT_AMBULATORY_CARE_PROVIDER_SITE_OTHER): Payer: Self-pay | Admitting: Infectious Disease

## 2019-01-04 ENCOUNTER — Encounter: Payer: Self-pay | Admitting: Infectious Disease

## 2019-01-04 ENCOUNTER — Other Ambulatory Visit: Payer: Self-pay

## 2019-01-04 VITALS — BP 157/102 | HR 84 | Temp 98.0°F | Ht 73.0 in | Wt 220.0 lb

## 2019-01-04 DIAGNOSIS — F17200 Nicotine dependence, unspecified, uncomplicated: Secondary | ICD-10-CM

## 2019-01-04 DIAGNOSIS — Z23 Encounter for immunization: Secondary | ICD-10-CM

## 2019-01-04 DIAGNOSIS — B20 Human immunodeficiency virus [HIV] disease: Secondary | ICD-10-CM

## 2019-01-04 DIAGNOSIS — I10 Essential (primary) hypertension: Secondary | ICD-10-CM

## 2019-01-04 MED ORDER — DOLUTEGRAVIR-LAMIVUDINE 50-300 MG PO TABS: 1.0000 | ORAL_TABLET | Freq: Every day | ORAL | 11 refills | Status: DC

## 2019-01-04 MED ORDER — AMLODIPINE BESYLATE 10 MG PO TABS: 10.0000 mg | ORAL_TABLET | Freq: Every day | ORAL | 11 refills | Status: DC

## 2019-01-04 NOTE — Addendum Note (Signed)
Addended by: Eugenia Mcalpine on: 01/04/2019 11:39 AM   Modules accepted: Orders

## 2019-01-04 NOTE — Patient Instructions (Signed)
We can do labs the same day you come for your appt in August

## 2019-01-04 NOTE — Progress Notes (Signed)
Verbal order per Dr. Tommy Medal to administer [facility] and prevnar-13 vaccines today. Vaccine administered and patient tolerated well.  Eugenia Mcalpine, LPN

## 2019-01-04 NOTE — Progress Notes (Signed)
Subjective:    Chief complaint: I would like to lose weight   Patient ID: Cole Carr, male    DOB: [birth year], 33 y.o.   MRN: [id number]  HPI  33 year old African-American male  diagnosed HIV infection who has been on Triumeq.  Working out quite a bit and trying to lose weight but having trouble doing the latter.  We discussed the idea of a carbohydrate restricted diet to help accomplish this.  He has been cutting down on smoking and has been using Chantix.  He did not take his hydrochlorothiazide today.  We discussed other options to HCTZ including amlodipine and this might fit better with his active lifestyle.     Past Medical History:  Diagnosis Date  . Dry skin 03/17/2017  . HIV infection ([place])   . Hypertension   . Visual acuity reduced 03/17/2017    No past surgical history on file.  Family History  Problem Relation Age of Onset  . Hyperlipidemia Mother       Social History   Socioeconomic History  . Marital status: Single    Spouse name: Not on file  . Number of children: Not on file  . Years of education: Not on file  . Highest education level: Not on file  Occupational History  . Not on file  Social Needs  . Financial resource strain: Not on file  . Food insecurity:    Worry: Not on file    Inability: Not on file  . Transportation needs:    Medical: Not on file    Non-medical: Not on file  Tobacco Use  . Smoking status: Current Every Day Smoker    Packs/day: 0.50    Types: Cigarettes    Start date: 01/25/2014  . Smokeless tobacco: Never Used  Substance and Sexual Activity  . Alcohol use: Yes    Alcohol/week: 2.0 standard drinks    Types: 2 Shots of liquor per week  . Drug use: Yes    Frequency: 3.0 times per week    Types: Marijuana  . Sexual activity: Yes    Partners: Female, Male    Birth control/protection: Condom  Lifestyle  . Physical activity:    Days per week: Not on file    Minutes per session: Not on file  . Stress: Not  on file  Relationships  . Social connections:    Talks on phone: Not on file    Gets together: Not on file    Attends religious service: Not on file    Active member of club or organization: Not on file    Attends meetings of clubs or organizations: Not on file    Relationship status: Not on file  Other Topics Concern  . Not on file  Social History Narrative  . Not on file    No Known Allergies   Current Outpatient Medications:  .  abacavir-dolutegravir-lamiVUDine (TRIUMEQ) 600-50-300 MG tablet, Take 1 tablet by mouth daily., Disp: 30 tablet, Rfl: 11 .  hydrochlorothiazide (HYDRODIURIL) 25 MG tablet, TAKE 1 TABLET BY MOUTH EVERY DAY, Disp: 30 tablet, Rfl: 1 .  varenicline (CHANTIX CONTINUING MONTH PAK) 1 MG tablet, Take 1 tablet (1 mg total) by mouth 2 (two) times daily., Disp: 60 tablet, Rfl: 1 .  varenicline (CHANTIX STARTING MONTH PAK) 0.5 MG X 11 & 1 MG X 42 tablet, 0.5mg  qdaily x 3d then 0.5 mg BID x  4d then 1 mg BID, Disp: 53 tablet, Rfl: 0    Review of systems  as listed above otherwise 12 point review of systems is negative    Objective:   Physical Exam  Constitutional: He is oriented to person, place, and time. He appears well-developed and well-nourished.  HENT:  Head: Normocephalic and atraumatic.  Mouth/Throat: No oropharyngeal exudate.  Eyes: Conjunctivae and EOM are normal. Right eye exhibits no discharge. Left eye exhibits no discharge. No scleral icterus.  Neck: Normal range of motion. Neck supple.  Cardiovascular: Normal rate and regular rhythm.  Pulmonary/Chest: Effort normal. No respiratory distress. He has no wheezes.  Abdominal: Soft. He exhibits no distension.  Musculoskeletal: Normal range of motion.        General: No tenderness or edema.  Neurological: He is alert and oriented to person, place, and time.  Skin: Skin is warm and dry. No rash noted. No erythema. No pallor.  Psychiatric: He has a normal mood and affect. His behavior is normal. Judgment  and thought content normal.  Nursing note and vitals reviewed.         Assessment & Plan:   HIV:   switch to Dovato and check labs when he comes back in August when you need to renew his HMA P   hypertension: Switch to amlodipine 10 mg  There were no vitals filed for this visit.   Smoking continue Chantix smoking cessation  Overweight: New efforts with carbohydrate restriction  I spent greater than 25 minutes with the patient including greater than 50% of time in face to face counsel of the patient regarding his prior antiretroviral regimen, controversy surrounding abacavir and cardiovascular risk, means to optimize hypertension weight and avoid smoking and also means of reducing risk for novel coronavirus 2019 infection and in coordination of his care.

## 2019-02-21 ENCOUNTER — Ambulatory Visit: Payer: Self-pay | Admitting: Infectious Disease

## 2019-05-12 ENCOUNTER — Telehealth: Payer: Self-pay

## 2019-05-12 NOTE — Telephone Encounter (Signed)
-----   Message from [place] sent at 05/12/2019 11:50 AM EDT ----- Regarding: Prescription refill Prescription refill for [facility]

## 2019-05-12 NOTE — Telephone Encounter (Signed)
Left a message 10/15 @ 5:16pm for the patient to call us and we can get him set up with medication while UMAP application is being processed.

## 2019-05-12 NOTE — Telephone Encounter (Signed)
Received prior authorization for patient's dovato from medicaid (family planning).  Patient did not renew his aDAP/RW.  Cole Carr will contact him and get him in for renewal as well as medication assistance for Dovato. Patient is over due for labs, visit with DR Tommy Medal, too. Cole Gandy, RN

## 2019-05-12 NOTE — Telephone Encounter (Signed)
Called patient to follow up on medication refill. Informed patient that he should have refills at pharmacy still and he should contact them for refills. Patient will call pharmacy to receive refills. Cole Carr

## 2019-05-13 ENCOUNTER — Encounter: Payer: Self-pay | Admitting: Infectious Disease

## 2019-05-13 ENCOUNTER — Telehealth: Payer: Self-pay | Admitting: Pharmacy Technician

## 2019-05-13 NOTE — Telephone Encounter (Signed)
[facility] Patient Advocate Encounter  Patient called needing help with medication while their UMAP application was pending. I called twice so far and left voicemails but have not heard back from the patient yet. We can provide him 28-days of dovato.

## 2019-05-16 ENCOUNTER — Telehealth: Payer: Self-pay

## 2019-05-16 ENCOUNTER — Other Ambulatory Visit: Payer: Self-pay

## 2019-05-16 NOTE — Telephone Encounter (Signed)
I have DOVATO with me as well that he could pickup from me

## 2019-05-16 NOTE — Telephone Encounter (Signed)
COVID-19 Pre-Screening Questions:05/16/19   Do you currently have a fever (>100 F), chills or unexplained body aches? NO  Are you currently experiencing new cough, shortness of breath, sore throat, runny nose? *NO .  Have you recently travelled outside the state of [place] in the last 14 days?NO .  Have you been in contact with someone that is currently pending confirmation of Covid19 testing or has been confirmed to have the Covid19 virus?  NO  **If the patient answers NO to ALL questions -  advise the patient to please call the clinic before coming to the office should any symptoms develop.     

## 2019-05-17 ENCOUNTER — Encounter: Payer: Self-pay | Admitting: Infectious Disease

## 2019-05-17 ENCOUNTER — Telehealth: Payer: Self-pay | Admitting: Pharmacy Technician

## 2019-05-17 ENCOUNTER — Other Ambulatory Visit: Payer: Self-pay

## 2019-05-17 ENCOUNTER — Ambulatory Visit (INDEPENDENT_AMBULATORY_CARE_PROVIDER_SITE_OTHER): Payer: Self-pay | Admitting: Infectious Disease

## 2019-05-17 VITALS — BP 147/99 | HR 81 | Temp 97.7°F | Wt 226.0 lb

## 2019-05-17 DIAGNOSIS — F17200 Nicotine dependence, unspecified, uncomplicated: Secondary | ICD-10-CM

## 2019-05-17 DIAGNOSIS — B20 Human immunodeficiency virus [HIV] disease: Secondary | ICD-10-CM

## 2019-05-17 DIAGNOSIS — Z23 Encounter for immunization: Secondary | ICD-10-CM

## 2019-05-17 MED ORDER — DOVATO 50-300 MG PO TABS: 1.0000 | ORAL_TABLET | Freq: Every day | ORAL | 11 refills | Status: DC

## 2019-05-17 MED ORDER — AMLODIPINE BESYLATE 10 MG PO TABS: 10.0000 mg | ORAL_TABLET | Freq: Every day | ORAL | 11 refills | Status: DC

## 2019-05-17 NOTE — Telephone Encounter (Signed)
[facility] Patient Advocate Encounter  Gave patient a 30 day free trial card for Dovato for this patient who is uninsured.    This will bridge him until [facility] approved.  BIN      [id number] PCN    [id number]    [id number] ID        [id number]   Cole Carr E. Nadara Mustard [place] Patient [facility] for Infectious Disease Phone: [phone] Fax:  [phone]

## 2019-05-17 NOTE — Progress Notes (Signed)
Subjective:    Chief complaint: I am out of my meds   Patient ID: Cole Carr, male    DOB: 12/08/1985, 33 y.o.   MRN: [id number]  HPI  33 year old African-American male with relatively recently  diagnosed HIV infection who has been on Triumeq.  I changed him to Dovato and plan on rechecking his labs but apparently he was incarcerated and was without meds for 30 days.  Unfortunately his smoking is worsened he is now smoking a pack a day again.  Out of his amlodipine     Past Medical History:  Diagnosis Date  . Dry skin 03/17/2017  . HIV infection ([place])   . Hypertension   . Visual acuity reduced 03/17/2017    No past surgical history on file.  Family History  Problem Relation Age of Onset  . Hyperlipidemia Mother       Social History   Socioeconomic History  . Marital status: Single    Spouse name: Not on file  . Number of children: Not on file  . Years of education: Not on file  . Highest education level: Not on file  Occupational History  . Not on file  Social Needs  . Financial resource strain: Not on file  . Food insecurity    Worry: Not on file    Inability: Not on file  . Transportation needs    Medical: Not on file    Non-medical: Not on file  Tobacco Use  . Smoking status: Current Every Day Smoker    Packs/day: 0.50    Types: Cigarettes    Start date: 01/25/2014  . Smokeless tobacco: Never Used  Substance and Sexual Activity  . Alcohol use: Yes    Alcohol/week: 2.0 standard drinks    Types: 2 Shots of liquor per week  . Drug use: Yes    Frequency: 3.0 times per week    Types: Marijuana  . Sexual activity: Yes    Partners: Female, Male    Birth control/protection: Condom  Lifestyle  . Physical activity    Days per week: Not on file    Minutes per session: Not on file  . Stress: Not on file  Relationships  . Social Herbalist on phone: Not on file    Gets together: Not on file    Attends religious service: Not on file   Active member of club or organization: Not on file    Attends meetings of clubs or organizations: Not on file    Relationship status: Not on file  Other Topics Concern  . Not on file  Social History Narrative  . Not on file    No Known Allergies   Current Outpatient Medications:  .  amLODipine (NORVASC) 10 MG tablet, Take 1 tablet (10 mg total) by mouth daily., Disp: 30 tablet, Rfl: 11 .  Dolutegravir-lamiVUDine (DOVATO) 50-300 MG TABS, Take 1 tablet by mouth daily., Disp: 30 tablet, Rfl: 11 .  varenicline (CHANTIX CONTINUING MONTH PAK) 1 MG tablet, Take 1 tablet (1 mg total) by mouth 2 (two) times daily., Disp: 60 tablet, Rfl: 1 .  varenicline (CHANTIX STARTING MONTH PAK) 0.5 MG X 11 & 1 MG X 42 tablet, 0.5mg  qdaily x 3d then 0.5 mg BID x  4d then 1 mg BID (Patient not taking: Reported on 01/04/2019), Disp: 53 tablet, Rfl: 0    Review of Systems  Constitutional: Negative for activity change, appetite change, chills, diaphoresis, fatigue, fever and unexpected weight change.  HENT: Negative for congestion, rhinorrhea, sinus pressure, sneezing, sore throat and trouble swallowing.   Eyes: Negative for photophobia and visual disturbance.  Respiratory: Negative for cough, chest tightness, shortness of breath, wheezing and stridor.   Cardiovascular: Negative for chest pain, palpitations and leg swelling.  Gastrointestinal: Negative for abdominal distention, abdominal pain, anal bleeding, blood in stool, constipation, diarrhea, nausea and vomiting.  Genitourinary: Negative for difficulty urinating, dysuria, flank pain and hematuria.  Musculoskeletal: Negative for arthralgias, back pain, gait problem and joint swelling.  Skin: Negative for color change, pallor, rash and wound.       Dry skin around eyes  Neurological: Negative for dizziness, tremors, syncope, weakness and light-headedness.  Hematological: Negative for adenopathy. Does not bruise/bleed easily.  Psychiatric/Behavioral: Negative  for agitation, behavioral problems, confusion, decreased concentration, dysphoric mood and sleep disturbance.       Objective:   Physical Exam  Constitutional: He is oriented to person, place, and time. He appears well-developed and well-nourished.  HENT:  Head: Normocephalic and atraumatic.  Mouth/Throat: No oropharyngeal exudate.  Eyes: Conjunctivae and EOM are normal. Right eye exhibits no discharge. Left eye exhibits no discharge. No scleral icterus.  Neck: Normal range of motion. Neck supple.  Cardiovascular: Normal rate and regular rhythm.  Pulmonary/Chest: Effort normal. No respiratory distress. He has no wheezes.  Abdominal: Soft. He exhibits no distension.  Musculoskeletal: Normal range of motion.        General: No tenderness or edema.  Lymphadenopathy:    He has no cervical adenopathy.  Neurological: He is alert and oriented to person, place, and time.  Skin: Skin is warm and dry. No rash noted. No erythema. No pallor.  Psychiatric: He has a normal mood and affect. His behavior is normal. Judgment and thought content normal.  Nursing note and vitals reviewed.         Assessment & Plan:   HIV: Restart Dovato.  Provided him with 28 days see pictures below  He also has a month supply through [facility] assistance and he should be enrolled in HMA P.  I would like him to purchase insurance through the [employer] using the P Program.  We will bring him back in 2 months time for lab check and to see me in January when he should then again renew his assistance program.  We will give him a flu shot and Pneumovax 23  Hypertension: continue amlodipine when he can get this filled  There were no vitals filed for this visit.   Smoking counseled him again to stop smoking I had prescribed Chantix before for him.

## 2019-07-25 ENCOUNTER — Other Ambulatory Visit: Payer: Self-pay

## 2019-08-08 ENCOUNTER — Encounter: Payer: Self-pay | Admitting: Infectious Disease

## 2019-10-20 ENCOUNTER — Other Ambulatory Visit: Payer: Self-pay

## 2019-10-20 ENCOUNTER — Ambulatory Visit: Payer: Self-pay

## 2019-11-01 ENCOUNTER — Encounter: Payer: Self-pay | Admitting: Infectious Disease

## 2019-11-24 ENCOUNTER — Telehealth: Payer: Self-pay | Admitting: *Deleted

## 2019-11-24 NOTE — Telephone Encounter (Signed)
Patient has returned to the area, let ADAP lapse.  He states he ran out of medication more than 1 week ago. He is meeting with Roe Coombs to renew ADAP 4/30.  Will have him meet with pharmacy too to see if it is safe to get him a short supply of Dovato until his ADAP is active again. Please advise. Andree Coss, RN

## 2019-11-24 NOTE — Telephone Encounter (Signed)
I  am fine with giving him Dovato in the interim I  believe we have a  large supply

## 2019-11-25 ENCOUNTER — Other Ambulatory Visit: Payer: Self-pay

## 2019-11-25 ENCOUNTER — Encounter: Payer: Self-pay | Admitting: Infectious Disease

## 2019-11-25 ENCOUNTER — Ambulatory Visit: Payer: Self-pay

## 2019-11-25 DIAGNOSIS — B20 Human immunodeficiency virus [HIV] disease: Secondary | ICD-10-CM

## 2019-11-25 DIAGNOSIS — F17200 Nicotine dependence, unspecified, uncomplicated: Secondary | ICD-10-CM

## 2019-11-25 LAB — T-HELPER CELL (CD4) - (RCID CLINIC ONLY)
CD4 % Helper T Cell: 34 % (ref 33–65)
CD4 T Cell Abs: 1343 /uL (ref 400–1790)

## 2019-11-25 NOTE — Telephone Encounter (Signed)
Perfect

## 2019-11-25 NOTE — Addendum Note (Signed)
Addended by: Mariea Clonts D on: 11/25/2019 10:47 AM   Modules accepted: Orders

## 2019-11-25 NOTE — Telephone Encounter (Signed)
We can meet with him during his 10:30 appointment and provide him a short supply of Dovato to bridge until adap approval.

## 2019-11-25 NOTE — Telephone Encounter (Signed)
He picked up 2 bottles (28 tablets) today after his labs.

## 2019-11-25 NOTE — Telephone Encounter (Signed)
I would give him 2 bottles of Dovato

## 2019-11-29 LAB — COMPLETE METABOLIC PANEL WITH GFR
AG Ratio: 1.5 (calc) (ref 1.0–2.5)
ALT: 84 U/L — ABNORMAL HIGH (ref 9–46)
AST: 59 U/L — ABNORMAL HIGH (ref 10–40)
Albumin: 4.5 g/dL (ref 3.6–5.1)
Alkaline phosphatase (APISO): 57 U/L (ref 36–130)
BUN: 11 mg/dL (ref 7–25)
CO2: 28 mmol/L (ref 20–32)
Calcium: 9.6 mg/dL (ref 8.6–10.3)
Chloride: 98 mmol/L (ref 98–110)
Creat: 1.06 mg/dL (ref 0.60–1.35)
GFR, Est African American: 106 mL/min/{1.73_m2} (ref >=60)
GFR, Est Non African American: 92 mL/min/{1.73_m2} (ref >=60)
Globulin: 3 g/dL (calc) (ref 1.9–3.7)
Glucose, Bld: 318 mg/dL — ABNORMAL HIGH (ref 65–99)
Potassium: 3.7 mmol/L (ref 3.5–5.3)
Sodium: 136 mmol/L (ref 135–146)
Total Bilirubin: 1 mg/dL (ref 0.2–1.2)
Total Protein: 7.5 g/dL (ref 6.1–8.1)

## 2019-11-29 LAB — CBC WITH DIFFERENTIAL/PLATELET
Absolute Monocytes: 429 cells/uL (ref 200–950)
Basophils Absolute: 33 cells/uL (ref 0–200)
Basophils Relative: 0.5 %
Eosinophils Absolute: 59 cells/uL (ref 15–500)
Eosinophils Relative: 0.9 %
HCT: 43 % (ref 38.5–50.0)
Hemoglobin: 14.4 g/dL (ref 13.2–17.1)
Lymphs Abs: 4303 cells/uL — ABNORMAL HIGH (ref 850–3900)
MCH: 28.1 pg (ref 27.0–33.0)
MCHC: 33.5 g/dL (ref 32.0–36.0)
MCV: 84 fL (ref 80.0–100.0)
MPV: 10.5 fL (ref 7.5–12.5)
Monocytes Relative: 6.6 %
Neutro Abs: 1677 cells/uL (ref 1500–7800)
Neutrophils Relative %: 25.8 %
Platelets: 225 10*3/uL (ref 140–400)
RBC: 5.12 10*6/uL (ref 4.20–5.80)
RDW: 12.4 % (ref 11.0–15.0)
Total Lymphocyte: 66.2 %
WBC: 6.5 10*3/uL (ref 3.8–10.8)

## 2019-11-29 LAB — HIV-1 RNA QUANT-NO REFLEX-BLD
HIV 1 RNA Quant: 3480 copies/mL — ABNORMAL HIGH
HIV-1 RNA Quant, Log: 3.54 Log copies/mL — ABNORMAL HIGH

## 2019-11-29 LAB — RPR: RPR Ser Ql: NONREACTIVE

## 2019-12-14 ENCOUNTER — Encounter: Payer: Self-pay | Admitting: Infectious Disease

## 2020-02-15 ENCOUNTER — Other Ambulatory Visit: Payer: Self-pay | Admitting: Infectious Disease

## 2020-02-15 DIAGNOSIS — Z113 Encounter for screening for infections with a predominantly sexual mode of transmission: Secondary | ICD-10-CM

## 2020-02-15 DIAGNOSIS — B20 Human immunodeficiency virus [HIV] disease: Secondary | ICD-10-CM

## 2020-02-15 DIAGNOSIS — Z79899 Other long term (current) drug therapy: Secondary | ICD-10-CM

## 2020-02-15 DIAGNOSIS — I10 Essential (primary) hypertension: Secondary | ICD-10-CM

## 2020-05-23 ENCOUNTER — Telehealth: Payer: Self-pay

## 2020-05-23 NOTE — Telephone Encounter (Signed)
Patient calls and states he has been in jail in [place] for 2 and half months Patient states his ADAP/RW has expired and he would like an emergency supply of medication. Explained to patient that he will need to be scheduled with financial team, labs, and provider visit. Patient states he did not receive any medications while he was in jail.  All appointments accepted.   Valarie Cones

## 2020-05-25 ENCOUNTER — Other Ambulatory Visit: Payer: Self-pay

## 2020-05-25 DIAGNOSIS — B20 Human immunodeficiency virus [HIV] disease: Secondary | ICD-10-CM

## 2020-05-25 DIAGNOSIS — Z113 Encounter for screening for infections with a predominantly sexual mode of transmission: Secondary | ICD-10-CM

## 2020-05-25 DIAGNOSIS — Z79899 Other long term (current) drug therapy: Secondary | ICD-10-CM

## 2020-05-28 ENCOUNTER — Ambulatory Visit: Payer: Self-pay

## 2020-05-28 ENCOUNTER — Other Ambulatory Visit: Payer: Self-pay

## 2020-05-28 ENCOUNTER — Encounter: Payer: Self-pay | Admitting: Infectious Disease

## 2020-05-28 DIAGNOSIS — Z79899 Other long term (current) drug therapy: Secondary | ICD-10-CM

## 2020-05-28 DIAGNOSIS — B20 Human immunodeficiency virus [HIV] disease: Secondary | ICD-10-CM

## 2020-05-28 DIAGNOSIS — Z113 Encounter for screening for infections with a predominantly sexual mode of transmission: Secondary | ICD-10-CM

## 2020-05-29 ENCOUNTER — Other Ambulatory Visit: Payer: Self-pay | Admitting: Infectious Disease

## 2020-05-29 LAB — URINE CYTOLOGY ANCILLARY ONLY
Chlamydia: NEGATIVE
Comment: NEGATIVE
Comment: NORMAL
Neisseria Gonorrhea: NEGATIVE

## 2020-05-29 LAB — T-HELPER CELL (CD4) - (RCID CLINIC ONLY)
CD4 % Helper T Cell: 38 % (ref 33–65)
CD4 T Cell Abs: 1216 /uL (ref 400–1790)

## 2020-05-30 ENCOUNTER — Telehealth: Payer: Self-pay

## 2020-05-30 NOTE — Telephone Encounter (Signed)
-----   Message from Randall Hiss, MD sent at 05/30/2020  9:34 AM EDT ----- He needs one Penicillin 2.4 million units ONCE for syphilis, partners treated, tested

## 2020-05-30 NOTE — Telephone Encounter (Signed)
Patient made aware of most recent  Reactive RPR results. Per Dr. Daiva Eves patient needs syphilis treatment with penicillin 2.4 million units  IM ONCE. Made aware that partner can be tested and treated at the health department.  Patient accepted appointment on 05/31/20 Cole Carr

## 2020-05-31 ENCOUNTER — Other Ambulatory Visit: Payer: Self-pay

## 2020-05-31 ENCOUNTER — Ambulatory Visit (INDEPENDENT_AMBULATORY_CARE_PROVIDER_SITE_OTHER): Payer: Self-pay

## 2020-05-31 VITALS — BP 133/87 | HR 82 | Temp 98.5°F

## 2020-05-31 DIAGNOSIS — A539 Syphilis, unspecified: Secondary | ICD-10-CM

## 2020-05-31 LAB — CBC WITH DIFFERENTIAL/PLATELET
Absolute Monocytes: 616 cells/uL (ref 200–950)
Basophils Absolute: 18 cells/uL (ref 0–200)
Basophils Relative: 0.3 %
Eosinophils Absolute: 49 cells/uL (ref 15–500)
Eosinophils Relative: 0.8 %
HCT: 39.8 % (ref 38.5–50.0)
Hemoglobin: 12.7 g/dL — ABNORMAL LOW (ref 13.2–17.1)
Lymphs Abs: 3184 cells/uL (ref 850–3900)
MCH: 26.6 pg — ABNORMAL LOW (ref 27.0–33.0)
MCHC: 31.9 g/dL — ABNORMAL LOW (ref 32.0–36.0)
MCV: 83.3 fL (ref 80.0–100.0)
MPV: 10.9 fL (ref 7.5–12.5)
Monocytes Relative: 10.1 %
Neutro Abs: 2233 cells/uL (ref 1500–7800)
Neutrophils Relative %: 36.6 %
Platelets: 246 10*3/uL (ref 140–400)
RBC: 4.78 10*6/uL (ref 4.20–5.80)
RDW: 13.1 % (ref 11.0–15.0)
Total Lymphocyte: 52.2 %
WBC: 6.1 10*3/uL (ref 3.8–10.8)

## 2020-05-31 LAB — LIPID PANEL
Cholesterol: 134 mg/dL (ref <200)
HDL: 11 mg/dL — ABNORMAL LOW (ref >=40)
LDL Cholesterol (Calc): 84 mg/dL (calc)
Non-HDL Cholesterol (Calc): 123 mg/dL (calc) (ref <130)
Total CHOL/HDL Ratio: 12.2 (calc) — ABNORMAL HIGH (ref <5.0)
Triglycerides: 323 mg/dL — ABNORMAL HIGH (ref <150)

## 2020-05-31 LAB — COMPLETE METABOLIC PANEL WITH GFR
AG Ratio: 1.1 (calc) (ref 1.0–2.5)
ALT: 35 U/L (ref 9–46)
AST: 29 U/L (ref 10–40)
Albumin: 4 g/dL (ref 3.6–5.1)
Alkaline phosphatase (APISO): 94 U/L (ref 36–130)
BUN: 9 mg/dL (ref 7–25)
CO2: 25 mmol/L (ref 20–32)
Calcium: 9.4 mg/dL (ref 8.6–10.3)
Chloride: 101 mmol/L (ref 98–110)
Creat: 0.83 mg/dL (ref 0.60–1.35)
GFR, Est African American: 133 mL/min/{1.73_m2} (ref >=60)
GFR, Est Non African American: 115 mL/min/{1.73_m2} (ref >=60)
Globulin: 3.7 g/dL (calc) (ref 1.9–3.7)
Glucose, Bld: 286 mg/dL — ABNORMAL HIGH (ref 65–99)
Potassium: 3.8 mmol/L (ref 3.5–5.3)
Sodium: 136 mmol/L (ref 135–146)
Total Bilirubin: 0.6 mg/dL (ref 0.2–1.2)
Total Protein: 7.7 g/dL (ref 6.1–8.1)

## 2020-05-31 LAB — HIV-1 RNA QUANT-NO REFLEX-BLD
HIV 1 RNA Quant: 474 Copies/mL — ABNORMAL HIGH
HIV-1 RNA Quant, Log: 2.68 Log cps/mL — ABNORMAL HIGH

## 2020-05-31 LAB — RPR: RPR Ser Ql: REACTIVE — AB

## 2020-05-31 LAB — FLUORESCENT TREPONEMAL AB(FTA)-IGG-BLD: Fluorescent Treponemal ABS: REACTIVE — AB

## 2020-05-31 LAB — RPR TITER: RPR Titer: 1:64 {titer} — ABNORMAL HIGH

## 2020-05-31 NOTE — Progress Notes (Signed)
Patient in the office today for syphilis treatment. Per Dr. Daiva Eves patient is to be treated with 1.4 million units bicillin IM once. Verified with patient no known allergies to penicillin.  Patient advise abstain from any sex post 10 day after treatment.  Chaperone present in the exam room during conversation and medication administration. Patient tolerated injections well.   While patient was being monitored in the office due to first dose of penicillin injection, patient disclosed that he was recently in the hospital over the weekend due to taking unprescribed medication of penicillin that made him sick. Patient later disclosed the unprescribed medication was over once year out of date.   LPN spoke with Dr. Daiva Eves and he advised that patient was sick due to taking expired medication and he does not have an allergy relation to penicillin. Patient verbalized understanding.  Patient also advised not to take any medication that is not prescribed to him and also not to take any expired medications as well.   Patient released after show NO signs of reactions to penicillin and accepted condoms.  Valarie Cones

## 2020-06-01 MED ORDER — PENICILLIN G BENZATHINE 1200000 UNIT/2ML IM SUSP
1.2000 10*6.[IU] | Freq: Once | INTRAMUSCULAR | Status: AC
  Administered 2020-05-31: 1.2 10*6.[IU] via INTRAMUSCULAR

## 2020-06-12 ENCOUNTER — Ambulatory Visit: Payer: Self-pay | Admitting: Infectious Disease

## 2020-06-19 ENCOUNTER — Other Ambulatory Visit: Payer: Self-pay

## 2020-06-19 ENCOUNTER — Telehealth: Payer: Self-pay

## 2020-06-19 DIAGNOSIS — B20 Human immunodeficiency virus [HIV] disease: Secondary | ICD-10-CM

## 2020-06-19 MED ORDER — DOVATO 50-300 MG PO TABS: 1.0000 | ORAL_TABLET | Freq: Every day | ORAL | 11 refills | Status: DC

## 2020-06-19 NOTE — Telephone Encounter (Signed)
Brooks from [employer] called inquiring about patient's symptoms regarding his positive RPR. RN informed her that patient came in for labs and Bicillin treatment, but that he no-showed for his appointment with Dr. Daiva Eves. There are no documented symptoms related to his RPR.  RN informed her that patient had a non-reactive RPR 11/25/19.   Sandie Ano, RN

## 2020-06-19 NOTE — Telephone Encounter (Signed)
Patient requesting refill for Dovato. Patient last seen 04/2019 and rx has curretently expired. Patient advised that he has to be seen by provider annually to continue refills. Patient verbalized understanding and accepts follow up appointment.  Approved 30 day supply until seen at next visit.  Valarie Cones

## 2020-07-18 ENCOUNTER — Ambulatory Visit: Payer: Self-pay | Admitting: Infectious Disease

## 2020-09-13 ENCOUNTER — Other Ambulatory Visit: Payer: Self-pay

## 2020-09-13 DIAGNOSIS — B20 Human immunodeficiency virus [HIV] disease: Secondary | ICD-10-CM

## 2020-09-13 DIAGNOSIS — Z113 Encounter for screening for infections with a predominantly sexual mode of transmission: Secondary | ICD-10-CM

## 2020-09-17 ENCOUNTER — Other Ambulatory Visit: Payer: Self-pay

## 2020-09-17 ENCOUNTER — Ambulatory Visit: Payer: Self-pay

## 2020-10-12 ENCOUNTER — Ambulatory Visit: Payer: Self-pay | Admitting: Infectious Disease

## 2020-11-08 ENCOUNTER — Ambulatory Visit: Payer: Self-pay

## 2020-11-08 ENCOUNTER — Other Ambulatory Visit: Payer: Self-pay

## 2020-11-30 ENCOUNTER — Other Ambulatory Visit: Payer: Self-pay

## 2020-11-30 ENCOUNTER — Encounter: Payer: Self-pay | Admitting: Infectious Disease

## 2020-11-30 ENCOUNTER — Ambulatory Visit: Payer: Self-pay | Admitting: Infectious Disease

## 2020-11-30 ENCOUNTER — Ambulatory Visit: Payer: Self-pay

## 2020-11-30 DIAGNOSIS — Z113 Encounter for screening for infections with a predominantly sexual mode of transmission: Secondary | ICD-10-CM

## 2020-11-30 DIAGNOSIS — B20 Human immunodeficiency virus [HIV] disease: Secondary | ICD-10-CM

## 2020-12-03 ENCOUNTER — Other Ambulatory Visit: Payer: Self-pay

## 2020-12-03 ENCOUNTER — Encounter: Payer: Self-pay | Admitting: Infectious Disease

## 2020-12-04 ENCOUNTER — Telehealth: Payer: Self-pay

## 2020-12-04 ENCOUNTER — Other Ambulatory Visit: Payer: Self-pay | Admitting: Infectious Disease

## 2020-12-04 DIAGNOSIS — B20 Human immunodeficiency virus [HIV] disease: Secondary | ICD-10-CM

## 2020-12-04 LAB — COMPLETE METABOLIC PANEL WITH GFR
AG Ratio: 1.6 (calc) (ref 1.0–2.5)
ALT: 51 U/L — ABNORMAL HIGH (ref 9–46)
AST: 32 U/L (ref 10–40)
Albumin: 4.5 g/dL (ref 3.6–5.1)
Alkaline phosphatase (APISO): 58 U/L (ref 36–130)
BUN: 8 mg/dL (ref 7–25)
CO2: 31 mmol/L (ref 20–32)
Calcium: 9.7 mg/dL (ref 8.6–10.3)
Chloride: 100 mmol/L (ref 98–110)
Creat: 1.02 mg/dL (ref 0.60–1.35)
GFR, Est African American: 111 mL/min/{1.73_m2} (ref >=60)
GFR, Est Non African American: 95 mL/min/{1.73_m2} (ref >=60)
Globulin: 2.9 g/dL (calc) (ref 1.9–3.7)
Glucose, Bld: 286 mg/dL — ABNORMAL HIGH (ref 65–99)
Potassium: 3.9 mmol/L (ref 3.5–5.3)
Sodium: 138 mmol/L (ref 135–146)
Total Bilirubin: 0.9 mg/dL (ref 0.2–1.2)
Total Protein: 7.4 g/dL (ref 6.1–8.1)

## 2020-12-04 LAB — CBC WITH DIFFERENTIAL/PLATELET
Absolute Monocytes: 488 cells/uL (ref 200–950)
Basophils Absolute: 18 cells/uL (ref 0–200)
Basophils Relative: 0.3 %
Eosinophils Absolute: 49 cells/uL (ref 15–500)
Eosinophils Relative: 0.8 %
HCT: 43 % (ref 38.5–50.0)
Hemoglobin: 14.2 g/dL (ref 13.2–17.1)
Lymphs Abs: 3440 cells/uL (ref 850–3900)
MCH: 28.3 pg (ref 27.0–33.0)
MCHC: 33 g/dL (ref 32.0–36.0)
MCV: 85.8 fL (ref 80.0–100.0)
MPV: 10.5 fL (ref 7.5–12.5)
Monocytes Relative: 8 %
Neutro Abs: 2105 cells/uL (ref 1500–7800)
Neutrophils Relative %: 34.5 %
Platelets: 224 10*3/uL (ref 140–400)
RBC: 5.01 10*6/uL (ref 4.20–5.80)
RDW: 12 % (ref 11.0–15.0)
Total Lymphocyte: 56.4 %
WBC: 6.1 10*3/uL (ref 3.8–10.8)

## 2020-12-04 LAB — T-HELPER CELLS (CD4) COUNT (NOT AT ARMC)
Absolute CD4: 1161 cells/uL (ref 490–1740)
CD4 T Helper %: 34 % (ref 30–61)
Total lymphocyte count: 3393 cells/uL (ref 850–3900)

## 2020-12-04 LAB — RPR: RPR Ser Ql: REACTIVE — AB

## 2020-12-04 LAB — FLUORESCENT TREPONEMAL AB(FTA)-IGG-BLD: Fluorescent Treponemal ABS: REACTIVE — AB

## 2020-12-04 LAB — HIV-1 RNA QUANT-NO REFLEX-BLD
HIV 1 RNA Quant: 50900 Copies/mL — ABNORMAL HIGH
HIV-1 RNA Quant, Log: 4.71 Log cps/mL — ABNORMAL HIGH

## 2020-12-04 LAB — RPR TITER: RPR Titer: 1:2 {titer} — ABNORMAL HIGH

## 2020-12-04 NOTE — Telephone Encounter (Signed)
RN spoke to patient, he reports being off medication for "about 2 weeks." He states he restarted his medication the day he came in for labs on 11/30/20. RN relayed that his most recent viral load is 50,900, he says he has enough medication to get him to his upcoming appointment on 12/21/20.   Patient asking about RPR results, RN advised there is no need for treatment at this time as his titer has come down from 1:64 to 1:2. Counseled patient on importance of taking his medication every day as prescribed. Advised that future refills can be provided at his next appointment. Patient verbalized understanding and has no further questions.   Sandie Ano, RN

## 2020-12-04 NOTE — Telephone Encounter (Signed)
-----   Message from Randall Hiss, MD sent at 12/04/2020 10:51 AM EDT ----- Patient not on meds I would assume?

## 2020-12-19 ENCOUNTER — Encounter: Payer: Self-pay | Admitting: Infectious Disease

## 2020-12-21 ENCOUNTER — Ambulatory Visit: Payer: Self-pay | Admitting: Infectious Disease

## 2020-12-27 ENCOUNTER — Ambulatory Visit: Payer: Self-pay | Admitting: Infectious Diseases

## 2021-05-20 ENCOUNTER — Telehealth: Payer: Self-pay

## 2021-05-20 NOTE — Telephone Encounter (Signed)
Spoke with patient to schedule overdue appointment, he states he needs more medication, but has not run out.   He will need to renew ADAP while here. He accepts appointment for next week.   Sandie Ano, RN

## 2021-05-21 NOTE — Telephone Encounter (Signed)
Thank you, Megan.

## 2021-05-27 ENCOUNTER — Ambulatory Visit: Payer: Self-pay

## 2021-05-27 ENCOUNTER — Other Ambulatory Visit: Payer: Self-pay

## 2021-05-27 ENCOUNTER — Encounter: Payer: Self-pay | Admitting: Infectious Disease

## 2021-05-27 ENCOUNTER — Ambulatory Visit (INDEPENDENT_AMBULATORY_CARE_PROVIDER_SITE_OTHER): Payer: Self-pay | Admitting: Pharmacist

## 2021-05-27 DIAGNOSIS — B20 Human immunodeficiency virus [HIV] disease: Secondary | ICD-10-CM

## 2021-05-27 DIAGNOSIS — Z113 Encounter for screening for infections with a predominantly sexual mode of transmission: Secondary | ICD-10-CM

## 2021-05-27 DIAGNOSIS — Z79899 Other long term (current) drug therapy: Secondary | ICD-10-CM

## 2021-05-27 NOTE — Progress Notes (Signed)
05/27/2021  HPI: Cole Carr is a 35 y.o. male who presents to the [facility] pharmacy clinic for HIV follow-up.  Patient Active Problem List   Diagnosis Date Noted   Dry skin 03/17/2017   Visual acuity reduced 03/17/2017   HIV disease (HCC) 07/04/2014   Smoker 07/04/2014   Hypertension 07/04/2014    Patient's Medications  New Prescriptions   No medications on file  Previous Medications   AMLODIPINE (NORVASC) 10 MG TABLET    Take 1 tablet (10 mg total) by mouth daily.   DOLUTEGRAVIR-LAMIVUDINE (DOVATO) 50-300 MG TABS    Take 1 tablet by mouth daily.  Modified Medications   No medications on file  Discontinued Medications   No medications on file    Allergies: No Known Allergies  Past Medical History: Past Medical History:  Diagnosis Date   Dry skin 03/17/2017   HIV infection (HCC)    Hypertension    Visual acuity reduced 03/17/2017    Social History: Social History   Socioeconomic History   Marital status: Single    Spouse name: Not on file   Number of children: Not on file   Years of education: Not on file   Highest education level: Not on file  Occupational History   Not on file  Tobacco Use   Smoking status: Every Day    Packs/day: 0.50    Types: Cigarettes    Start date: 01/25/2014   Smokeless tobacco: Never  Substance and Sexual Activity   Alcohol use: Yes    Alcohol/week: 2.0 standard drinks    Types: 2 Shots of liquor per week   Drug use: Not Currently   Sexual activity: Not Currently    Partners: Female, Male    Birth control/protection: Condom    Comment: declined condoms  Other Topics Concern   Not on file  Social History Narrative   Not on file   Social Determinants of Health   Financial Resource Strain: Not on file  Food Insecurity: Not on file  Transportation Needs: Not on file  Physical Activity: Not on file  Stress: Not on file  Social Connections: Not on file    Labs: Lab Results  Component Value Date   HIV1RNAQUANT  50,900 (H) 11/30/2020   HIV1RNAQUANT 474 (H) 05/28/2020   HIV1RNAQUANT 3,480 (H) 11/25/2019   CD4TABS 1,216 05/28/2020   CD4TABS 1,343 11/25/2019   CD4TABS 1,120 04/07/2018    RPR and STI Lab Results  Component Value Date   LABRPR REACTIVE (A) 11/30/2020   LABRPR REACTIVE (A) 05/28/2020   LABRPR NON-REACTIVE 11/25/2019   LABRPR NON-REACTIVE 10/05/2017   LABRPR NON REAC 03/17/2017   RPRTITER 1:2 (H) 11/30/2020   RPRTITER 1:64 (H) 05/28/2020    STI Results GC CT  05/28/2020 Negative Negative  11/19/2016 Negative Negative  11/06/2015 Negative Negative  06/20/2014 NG: Negative CT: Negative    Hepatitis B Lab Results  Component Value Date   HEPBSAB NON-REACTIVE 03/17/2017   HEPBSAG NEGATIVE 06/20/2014   HEPBCAB NON REACTIVE 06/20/2014   Hepatitis C No results found for: HEPCAB, HCVRNAPCRQN Hepatitis A Lab Results  Component Value Date   HAV NON REACTIVE 03/17/2017   Lipids: Lab Results  Component Value Date   CHOL 134 05/28/2020   TRIG 323 (H) 05/28/2020   HDL 11 (L) 05/28/2020   CHOLHDL 12.2 (H) 05/28/2020   VLDL 26 03/17/2017   LDLCALC 84 05/28/2020    Current HIV Regimen: Dovato  Assessment: Cole Carr presents for HIV follow up. He states he  ran out of Dovato yesterday. Based on his fill history he should have run out sooner than this. Reports that when he is running low he will take them every other day. Explained that this can lead to resistance and, while we would ideally like him to take his medication daily with no missed doses, it is better to stop taking completely than to take it every other day when he is running out. He confirms understanding. He arrived ~30 minutes late to his visit today and states he does not have time to get lab work today so I scheduled him for labs next week.   Plan: -He picked up Dovato samples today and renewed ADAP -Labs scheduled 06/06/21. Check HIV RNA + genotype, CD4 count, CBC, CMET, lipid panel, RPR, urine G/C  cytology -Follow up visit with Dr. Daiva Eves on 07/08/21    [name], PharmD PGY2 Ambulatory Care Pharmacy Resident 05/27/2021 11:06 AM

## 2021-06-04 ENCOUNTER — Other Ambulatory Visit: Payer: Self-pay | Admitting: Pharmacist

## 2021-06-04 DIAGNOSIS — B20 Human immunodeficiency virus [HIV] disease: Secondary | ICD-10-CM

## 2021-06-04 MED ORDER — DOVATO 50-300 MG PO TABS
1.0000 | ORAL_TABLET | Freq: Every day | ORAL | 0 refills | Status: DC
Start: 2021-05-27 — End: 2021-09-17

## 2021-06-04 NOTE — Progress Notes (Signed)
Medication Samples have been provided to the patient.  Drug name: Dovato        Strength: 50/300 mg         Qty: 14  Tablets (1 bottles) LOT: [id number]   Exp.Date: 08/28/22  Dosing instructions: Take one tablet by mouth once daily  The patient has been instructed regarding the correct time, dose, and frequency of taking this medication, including desired effects and most common side effects.   Margarite Gouge, PharmD, CPP Clinical Pharmacist Practitioner Infectious Diseases Clinical Pharmacist [facility]

## 2021-06-06 ENCOUNTER — Other Ambulatory Visit: Payer: Self-pay

## 2021-06-06 DIAGNOSIS — B20 Human immunodeficiency virus [HIV] disease: Secondary | ICD-10-CM

## 2021-06-06 DIAGNOSIS — Z79899 Other long term (current) drug therapy: Secondary | ICD-10-CM

## 2021-06-06 DIAGNOSIS — Z113 Encounter for screening for infections with a predominantly sexual mode of transmission: Secondary | ICD-10-CM

## 2021-06-07 LAB — URINE CYTOLOGY ANCILLARY ONLY
Chlamydia: NEGATIVE
Comment: NEGATIVE
Comment: NORMAL
Neisseria Gonorrhea: NEGATIVE

## 2021-06-10 LAB — LIPID PANEL
Cholesterol: 220 mg/dL — ABNORMAL HIGH (ref <200)
HDL: 40 mg/dL (ref >=40)
LDL Cholesterol (Calc): 140 mg/dL (calc) — ABNORMAL HIGH
Non-HDL Cholesterol (Calc): 180 mg/dL (calc) — ABNORMAL HIGH (ref <130)
Total CHOL/HDL Ratio: 5.5 (calc) — ABNORMAL HIGH (ref <5.0)
Triglycerides: 260 mg/dL — ABNORMAL HIGH (ref <150)

## 2021-06-10 LAB — RPR TITER: RPR Titer: 1:2 {titer} — ABNORMAL HIGH

## 2021-06-10 LAB — COMPREHENSIVE METABOLIC PANEL
AG Ratio: 1.6 (calc) (ref 1.0–2.5)
ALT: 46 U/L (ref 9–46)
AST: 30 U/L (ref 10–40)
Albumin: 4.6 g/dL (ref 3.6–5.1)
Alkaline phosphatase (APISO): 51 U/L (ref 36–130)
BUN: 14 mg/dL (ref 7–25)
CO2: 29 mmol/L (ref 20–32)
Calcium: 9.7 mg/dL (ref 8.6–10.3)
Chloride: 97 mmol/L — ABNORMAL LOW (ref 98–110)
Creat: 1.24 mg/dL (ref 0.60–1.26)
Globulin: 2.9 g/dL (calc) (ref 1.9–3.7)
Glucose, Bld: 326 mg/dL — ABNORMAL HIGH (ref 65–99)
Potassium: 4.2 mmol/L (ref 3.5–5.3)
Sodium: 135 mmol/L (ref 135–146)
Total Bilirubin: 1 mg/dL (ref 0.2–1.2)
Total Protein: 7.5 g/dL (ref 6.1–8.1)

## 2021-06-10 LAB — CBC
HCT: 44.3 % (ref 38.5–50.0)
Hemoglobin: 14.7 g/dL (ref 13.2–17.1)
MCH: 28.2 pg (ref 27.0–33.0)
MCHC: 33.2 g/dL (ref 32.0–36.0)
MCV: 84.9 fL (ref 80.0–100.0)
MPV: 10.6 fL (ref 7.5–12.5)
Platelets: 240 10*3/uL (ref 140–400)
RBC: 5.22 10*6/uL (ref 4.20–5.80)
RDW: 12.1 % (ref 11.0–15.0)
WBC: 6.7 10*3/uL (ref 3.8–10.8)

## 2021-06-10 LAB — HIV RNA, RTPCR W/R GT (RTI, PI,INT)
HIV 1 RNA Quant: 20 copies/mL — ABNORMAL HIGH
HIV-1 RNA Quant, Log: 1.3 Log copies/mL — ABNORMAL HIGH

## 2021-06-10 LAB — FLUORESCENT TREPONEMAL AB(FTA)-IGG-BLD: Fluorescent Treponemal ABS: NONREACTIVE

## 2021-06-10 LAB — RPR: RPR Ser Ql: REACTIVE — AB

## 2021-06-27 ENCOUNTER — Other Ambulatory Visit: Payer: Self-pay

## 2021-06-27 DIAGNOSIS — B20 Human immunodeficiency virus [HIV] disease: Secondary | ICD-10-CM

## 2021-06-27 MED ORDER — DOVATO 50-300 MG PO TABS: 1.0000 | ORAL_TABLET | Freq: Every day | ORAL | 0 refills | Status: DC

## 2021-07-01 ENCOUNTER — Telehealth: Payer: Self-pay

## 2021-07-01 NOTE — Telephone Encounter (Signed)
Patient called office with concerns regarding RPR results. Informed patient that her has a low titer and does not show reinfection. Informed him that office would retest in six months. Encouraged patient to use condoms with all sexual partners to avoid STD infection. Pt did not have additional questions. Juanita Laster, RMA

## 2021-07-01 NOTE — Telephone Encounter (Signed)
Patient called saying his Dovato wasn't ready at the pharmacy. Advised that it was sent to [facility] on [address] on 12/1, he would like to pick it up at [facility] on [address]. In [place]. Advised that he just needs to have [facility] transfer it to his desired location. Patient verbalized understanding and has no further questions.   Sandie Ano, RN

## 2021-07-08 ENCOUNTER — Ambulatory Visit: Payer: Self-pay | Admitting: Infectious Disease

## 2021-09-17 ENCOUNTER — Other Ambulatory Visit: Payer: Self-pay

## 2021-09-17 ENCOUNTER — Other Ambulatory Visit (HOSPITAL_COMMUNITY): Payer: Self-pay

## 2021-09-17 ENCOUNTER — Ambulatory Visit: Payer: Self-pay

## 2021-09-17 ENCOUNTER — Ambulatory Visit (INDEPENDENT_AMBULATORY_CARE_PROVIDER_SITE_OTHER): Payer: Self-pay | Admitting: Pharmacist

## 2021-09-17 DIAGNOSIS — Z113 Encounter for screening for infections with a predominantly sexual mode of transmission: Secondary | ICD-10-CM

## 2021-09-17 DIAGNOSIS — Z79899 Other long term (current) drug therapy: Secondary | ICD-10-CM

## 2021-09-17 DIAGNOSIS — B20 Human immunodeficiency virus [HIV] disease: Secondary | ICD-10-CM

## 2021-09-17 MED ORDER — DOVATO 50-300 MG PO TABS: 1.0000 | ORAL_TABLET | Freq: Every day | ORAL | 2 refills | Status: DC

## 2021-09-17 NOTE — Progress Notes (Signed)
09/17/2021  HPI: Cole Carr is a 36 y.o. male who presents to the [facility] pharmacy clinic for HIV follow-up.  Patient Active Problem List   Diagnosis Date Noted   Dry skin 03/17/2017   Visual acuity reduced 03/17/2017   HIV disease (HCC) 07/04/2014   Smoker 07/04/2014   Hypertension 07/04/2014    Patient's Medications  New Prescriptions   No medications on file  Previous Medications   AMLODIPINE (NORVASC) 10 MG TABLET    Take 1 tablet (10 mg total) by mouth daily.   DOLUTEGRAVIR-LAMIVUDINE (DOVATO) 50-300 MG TABLET    Take 1 tablet by mouth daily for 14 days.   DOLUTEGRAVIR-LAMIVUDINE (DOVATO) 50-300 MG TABLET    Take 1 tablet by mouth daily.  Modified Medications   No medications on file  Discontinued Medications   No medications on file    Allergies: No Known Allergies  Past Medical History: Past Medical History:  Diagnosis Date   Dry skin 03/17/2017   HIV infection (HCC)    Hypertension    Visual acuity reduced 03/17/2017    Social History: Social History   Socioeconomic History   Marital status: Single    Spouse name: Not on file   Number of children: Not on file   Years of education: Not on file   Highest education level: Not on file  Occupational History   Not on file  Tobacco Use   Smoking status: Every Day    Packs/day: 0.50    Types: Cigarettes    Start date: 01/25/2014   Smokeless tobacco: Never  Substance and Sexual Activity   Alcohol use: Yes    Alcohol/week: 2.0 standard drinks    Types: 2 Shots of liquor per week   Drug use: Not Currently   Sexual activity: Not Currently    Partners: Female, Male    Birth control/protection: Condom    Comment: declined condoms  Other Topics Concern   Not on file  Social History Narrative   Not on file   Social Determinants of Health   Financial Resource Strain: Not on file  Food Insecurity: Not on file  Transportation Needs: Not on file  Physical Activity: Not on file  Stress: Not on file   Social Connections: Not on file    Labs: Lab Results  Component Value Date   HIV1RNAQUANT 20 (H) 06/06/2021   HIV1RNAQUANT 50,900 (H) 11/30/2020   HIV1RNAQUANT 474 (H) 05/28/2020   CD4TABS 1,216 05/28/2020   CD4TABS 1,343 11/25/2019   CD4TABS 1,120 04/07/2018    RPR and STI Lab Results  Component Value Date   LABRPR REACTIVE (A) 06/06/2021   LABRPR REACTIVE (A) 11/30/2020   LABRPR REACTIVE (A) 05/28/2020   LABRPR NON-REACTIVE 11/25/2019   LABRPR NON-REACTIVE 10/05/2017   RPRTITER 1:2 (H) 06/06/2021   RPRTITER 1:2 (H) 11/30/2020   RPRTITER 1:64 (H) 05/28/2020    STI Results GC CT  06/06/2021 Negative Negative  05/28/2020 Negative Negative  11/19/2016 Negative Negative  11/06/2015 Negative Negative  06/20/2014 NG: Negative CT: Negative    Hepatitis B Lab Results  Component Value Date   HEPBSAB NON-REACTIVE 03/17/2017   HEPBSAG NEGATIVE 06/20/2014   HEPBCAB NON REACTIVE 06/20/2014   Hepatitis C No results found for: HEPCAB, HCVRNAPCRQN Hepatitis A Lab Results  Component Value Date   HAV NON REACTIVE 03/17/2017   Lipids: Lab Results  Component Value Date   CHOL 220 (H) 06/06/2021   TRIG 260 (H) 06/06/2021   HDL 40 06/06/2021   CHOLHDL 5.5 (H)  06/06/2021   VLDL 26 03/17/2017   LDLCALC 140 (H) 06/06/2021    Current HIV Regimen: Dovato  Assessment: Cole Carr comes in today after missing one appointment with Dr. Daiva Eves in December. I previously saw him in October where he was taking his Dovato every other day due to running low on refills due to missed appointments. His viral load at that time was 20 so a genotype could not be done.  He comes in today to renew his [name] but has run out of Dovato again because of another missed appointment. He has been out for a few weeks. I don't think he is a good candidate for Dovato since he misses so many doses and takes it unreliably. He would probably benefit from [name]. He cannot stay for labs today, so I made  an appointment for next week for lab work. I also rescheduled his appointment with Dr. Daiva Eves and told him to make sure to come. I will give him a bottle of Dovato until he can pick up his refills.   Of note, his glucose was high and his cholesterol was out of range on his previous lab work in November. Will recheck today for Dr. Clinton Gallant appointment.   Plan: - Lab work next week - F/u with Dr. Daiva Eves on 3/16  Quin Mathenia L. Layan Zalenski, PharmD, BCIDP, AAHIVP, CPP Clinical Pharmacist Practitioner Infectious Diseases Clinical Pharmacist [facility] 09/17/2021, 3:30 PM

## 2021-09-18 ENCOUNTER — Ambulatory Visit: Payer: Self-pay | Admitting: Pharmacist

## 2021-09-20 ENCOUNTER — Other Ambulatory Visit: Payer: Self-pay | Admitting: Pharmacist

## 2021-09-20 DIAGNOSIS — B20 Human immunodeficiency virus [HIV] disease: Secondary | ICD-10-CM

## 2021-09-20 MED ORDER — DOVATO 50-300 MG PO TABS: 1.0000 | ORAL_TABLET | Freq: Every day | ORAL | 0 refills | Status: DC

## 2021-09-20 NOTE — Progress Notes (Signed)
Medication Samples have been provided to the patient.  Drug name: Dovato        Strength: 50/300 mg         Qty: 14  Tablets (1 bottles) LOT: CL4G   Exp.Date: 6/24  Dosing instructions: Take one tablet by mouth once daily  The patient has been instructed regarding the correct time, dose, and frequency of taking this medication, including desired effects and most common side effects.   Margarite Gouge, PharmD, CPP Clinical Pharmacist Practitioner Infectious Diseases Clinical Pharmacist [facility]

## 2021-09-27 ENCOUNTER — Other Ambulatory Visit: Payer: Self-pay

## 2021-10-10 ENCOUNTER — Telehealth: Payer: Self-pay

## 2021-10-10 ENCOUNTER — Ambulatory Visit: Payer: Self-pay | Admitting: Infectious Disease

## 2021-10-10 NOTE — Telephone Encounter (Signed)
Called patient to reschedule missed appointment this morning. No answer. Left HIPAA-compliant voicemail requesting a call back. ? ?Wyvonne Lenz, RN  ?

## 2022-05-22 ENCOUNTER — Ambulatory Visit (INDEPENDENT_AMBULATORY_CARE_PROVIDER_SITE_OTHER): Payer: Self-pay | Admitting: Infectious Disease

## 2022-05-22 ENCOUNTER — Other Ambulatory Visit: Payer: Self-pay

## 2022-05-22 ENCOUNTER — Ambulatory Visit: Payer: Self-pay

## 2022-05-22 ENCOUNTER — Other Ambulatory Visit (HOSPITAL_COMMUNITY): Payer: Self-pay

## 2022-05-22 ENCOUNTER — Encounter: Payer: Self-pay | Admitting: Infectious Disease

## 2022-05-22 VITALS — BP 145/102 | HR 98 | Temp 99.2°F | Ht 73.0 in | Wt 214.0 lb

## 2022-05-22 DIAGNOSIS — B20 Human immunodeficiency virus [HIV] disease: Secondary | ICD-10-CM

## 2022-05-22 DIAGNOSIS — G629 Polyneuropathy, unspecified: Secondary | ICD-10-CM

## 2022-05-22 DIAGNOSIS — F17200 Nicotine dependence, unspecified, uncomplicated: Secondary | ICD-10-CM

## 2022-05-22 DIAGNOSIS — Z23 Encounter for immunization: Secondary | ICD-10-CM

## 2022-05-22 DIAGNOSIS — I10 Essential (primary) hypertension: Secondary | ICD-10-CM

## 2022-05-22 DIAGNOSIS — F900 Attention-deficit hyperactivity disorder, predominantly inattentive type: Secondary | ICD-10-CM

## 2022-05-22 MED ORDER — AMLODIPINE BESYLATE 10 MG PO TABS: 10.0000 mg | ORAL_TABLET | Freq: Every day | ORAL | 11 refills | Status: DC

## 2022-05-22 MED ORDER — DOVATO 50-300 MG PO TABS: 1.0000 | ORAL_TABLET | Freq: Every day | ORAL | 5 refills | Status: DC

## 2022-05-22 NOTE — Progress Notes (Signed)
Subjective:  Chief complaint is pain and numbness in his left hand that previously responded to steroids.   Patient ID: Cole Carr, male    DOB: 1986-04-02, 36 y.o.   MRN: [id number]  HPI  Cole Carr is a 36 year old 34 man with HIV, ? ADHD, hypertension who I have not seen in 3 years. His HMAP expired and he did not renew. He says of his last bottle of Dovato a month ago.  He is complaining of pain in his neck and back and also in his arm and hand specifically has some numbness and discoloration in the that occurs what appears to be the medial distribution of his left hand.  That he has been evaluated for this but in the past and including trips to the ER.  I can see that he had a CT of the neck done at [place] in 2021 that was without any pathology.  I do not see the MR imaging having been done.  He says the pain is improving now and that typically these episodes have resolved on their own with time but this is at least the third 1 in the last several years and it is distressing to him.  He is also concerned re his ability to focus.    Past Medical History:  Diagnosis Date   Dry skin 03/17/2017   HIV infection ([place])    Hypertension    Visual acuity reduced 03/17/2017    No past surgical history on file.  Family History  Problem Relation Age of Onset   Hyperlipidemia Mother       Social History   Socioeconomic History   Marital status: Single    Spouse name: Not on file   Number of children: Not on file   Years of education: Not on file   Highest education level: Not on file  Occupational History   Not on file  Tobacco Use   Smoking status: Every Day    Packs/day: 0.50    Types: Cigarettes    Start date: 01/25/2014   Smokeless tobacco: Never  Substance and Sexual Activity   Alcohol use: Yes    Alcohol/week: 2.0 standard drinks of alcohol    Types: 2 Shots of liquor per week   Drug use: Not Currently   Sexual activity: Not Currently    Partners: Female,  Male    Birth control/protection: Condom    Comment: declined condoms  Other Topics Concern   Not on file  Social History Narrative   Not on file   Social Determinants of Health   Financial Resource Strain: Not on file  Food Insecurity: Not on file  Transportation Needs: Not on file  Physical Activity: Not on file  Stress: Not on file  Social Connections: Not on file    No Known Allergies   Current Outpatient Medications:    amLODipine (NORVASC) 10 MG tablet, Take 1 tablet (10 mg total) by mouth daily., Disp: 30 tablet, Rfl: 11   dolutegravir-lamiVUDine (DOVATO) 50-300 MG tablet, Take 1 tablet by mouth daily., Disp: 30 tablet, Rfl: 2   dolutegravir-lamiVUDine (DOVATO) 50-300 MG tablet, Take 1 tablet by mouth daily for 14 days., Disp: 14 tablet, Rfl: 0   Review of Systems  Constitutional:  Negative for activity change, appetite change, chills, diaphoresis, fatigue, fever and unexpected weight change.  HENT:  Negative for congestion, rhinorrhea, sinus pressure, sneezing, sore throat and trouble swallowing.   Eyes:  Negative for photophobia and visual disturbance.  Respiratory:  Negative for cough, chest tightness, shortness of breath, wheezing and stridor.   Cardiovascular:  Negative for chest pain, palpitations and leg swelling.  Gastrointestinal:  Negative for abdominal distention, abdominal pain, anal bleeding, blood in stool, constipation, diarrhea, nausea and vomiting.  Genitourinary:  Negative for difficulty urinating, dysuria, flank pain and hematuria.  Musculoskeletal:  Positive for arthralgias and back pain. Negative for gait problem, joint swelling and myalgias.  Skin:  Negative for color change, pallor, rash and wound.  Neurological:  Negative for dizziness, tremors, weakness, light-headedness and headaches.  Hematological:  Negative for adenopathy. Does not bruise/bleed easily.  Psychiatric/Behavioral:  Positive for sleep disturbance. Negative for agitation,  behavioral problems, confusion, decreased concentration, dysphoric mood and suicidal ideas.        Objective:   Physical Exam Constitutional:      Appearance: He is well-developed.  HENT:     Head: Normocephalic and atraumatic.  Eyes:     Conjunctiva/sclera: Conjunctivae normal.  Cardiovascular:     Rate and Rhythm: Normal rate and regular rhythm.  Pulmonary:     Effort: Pulmonary effort is normal. No respiratory distress.     Breath sounds: No wheezing.  Abdominal:     General: There is no distension.     Palpations: Abdomen is soft.  Musculoskeletal:        General: No tenderness. Normal range of motion.     Cervical back: Normal range of motion and neck supple.  Skin:    General: Skin is warm and dry.     Coloration: Skin is not pale.     Findings: No erythema or rash.  Neurological:     General: No focal deficit present.     Mental Status: He is alert and oriented to person, place, and time.  Psychiatric:        Mood and Affect: Mood normal.        Behavior: Behavior normal.        Thought Content: Thought content normal.        Judgment: Judgment normal.           Assessment & Plan:   HIV disease:  I will add order HIV viral load CD4 count CBC with differential CMP, RPR GC and chlamydia and I will continue  [facility] prescription and I gave him a bottle of 28 pills from samples  STI screen for gonorrhea chlamydia urine oropharynx and rectum.  ADHD: We will refer to primary care.  Hypertension we will renew his amlodipine prescription and refer to primary care.  Neck pain back pain with what sounds like neuropathic pain and medial nerve distribution:  Ideally should have an MRI of the C-spine performed potentially nerve conduction studies.  We will need to obtain an ACA plan using PCAP or ICAP or acquire medicaid  Vaccine counseling: Mended flu and COVID vaccines which were given today

## 2022-05-23 LAB — URINE CYTOLOGY ANCILLARY ONLY
Chlamydia: NEGATIVE
Comment: NEGATIVE
Comment: NORMAL
Neisseria Gonorrhea: NEGATIVE

## 2022-05-23 LAB — CYTOLOGY, (ORAL, ANAL, URETHRAL) ANCILLARY ONLY
Chlamydia: NEGATIVE
Chlamydia: NEGATIVE
Comment: NEGATIVE
Comment: NEGATIVE
Comment: NORMAL
Comment: NORMAL
Neisseria Gonorrhea: NEGATIVE
Neisseria Gonorrhea: NEGATIVE

## 2022-05-23 LAB — T-HELPER CELLS (CD4) COUNT (NOT AT ARMC)
CD4 % Helper T Cell: 39 % (ref 33–65)
CD4 T Cell Abs: 870 /uL (ref 400–1790)

## 2022-05-26 ENCOUNTER — Other Ambulatory Visit: Payer: Self-pay | Admitting: Pharmacist

## 2022-05-26 DIAGNOSIS — B20 Human immunodeficiency virus [HIV] disease: Secondary | ICD-10-CM

## 2022-05-26 MED ORDER — DOVATO 50-300 MG PO TABS: 1.0000 | ORAL_TABLET | Freq: Every day | ORAL | 0 refills | Status: DC

## 2022-05-26 NOTE — Progress Notes (Signed)
Medication Samples have been provided to the patient.  Drug name: Dovato        Strength: 50/300 mg         Qty: 28 tablets (2 bottles)   LOT: [id number]   Exp.Date: 05/2023  Dosing instructions: Take one tablet by mouth once daily  The patient has been instructed regarding the correct time, dose, and frequency of taking this medication, including desired effects and most common side effects.   Feliz Herard L. Jnyah Brazee, PharmD, BCIDP, AAHIVP, CPP Clinical Pharmacist Practitioner Infectious Diseases Clinical Pharmacist [facility] 07/09/2020, 10:07 AM  

## 2022-06-01 LAB — CBC WITH DIFFERENTIAL/PLATELET
Absolute Monocytes: 442 cells/uL (ref 200–950)
Basophils Absolute: 28 cells/uL (ref 0–200)
Basophils Relative: 0.5 %
Eosinophils Absolute: 50 cells/uL (ref 15–500)
Eosinophils Relative: 0.9 %
HCT: 42.9 % (ref 38.5–50.0)
Hemoglobin: 14.3 g/dL (ref 13.2–17.1)
Lymphs Abs: 2419 cells/uL (ref 850–3900)
MCH: 28.3 pg (ref 27.0–33.0)
MCHC: 33.3 g/dL (ref 32.0–36.0)
MCV: 84.8 fL (ref 80.0–100.0)
MPV: 10.4 fL (ref 7.5–12.5)
Monocytes Relative: 7.9 %
Neutro Abs: 2660 cells/uL (ref 1500–7800)
Neutrophils Relative %: 47.5 %
Platelets: 234 10*3/uL (ref 140–400)
RBC: 5.06 10*6/uL (ref 4.20–5.80)
RDW: 12.9 % (ref 11.0–15.0)
Total Lymphocyte: 43.2 %
WBC: 5.6 10*3/uL (ref 3.8–10.8)

## 2022-06-01 LAB — HIV-1 INTEGRASE GENOTYPE

## 2022-06-01 LAB — HIV RNA, RTPCR W/R GT (RTI, PI,INT)
HIV 1 RNA Quant: 1840 copies/mL — ABNORMAL HIGH
HIV-1 RNA Quant, Log: 3.26 Log copies/mL — ABNORMAL HIGH

## 2022-06-01 LAB — COMPLETE METABOLIC PANEL WITH GFR
AG Ratio: 1.6 (calc) (ref 1.0–2.5)
ALT: 48 U/L — ABNORMAL HIGH (ref 9–46)
AST: 44 U/L — ABNORMAL HIGH (ref 10–40)
Albumin: 4.7 g/dL (ref 3.6–5.1)
Alkaline phosphatase (APISO): 59 U/L (ref 36–130)
BUN: 11 mg/dL (ref 7–25)
CO2: 29 mmol/L (ref 20–32)
Calcium: 9.5 mg/dL (ref 8.6–10.3)
Chloride: 96 mmol/L — ABNORMAL LOW (ref 98–110)
Creat: 0.94 mg/dL (ref 0.60–1.26)
Globulin: 3 g/dL (calc) (ref 1.9–3.7)
Glucose, Bld: 334 mg/dL — ABNORMAL HIGH (ref 65–99)
Potassium: 3.8 mmol/L (ref 3.5–5.3)
Sodium: 136 mmol/L (ref 135–146)
Total Bilirubin: 1.4 mg/dL — ABNORMAL HIGH (ref 0.2–1.2)
Total Protein: 7.7 g/dL (ref 6.1–8.1)
eGFR: 108 mL/min/{1.73_m2} (ref >=60)

## 2022-06-01 LAB — HIV-1 GENOTYPE: HIV-1 Genotype: DETECTED — AB

## 2022-06-01 LAB — RPR: RPR Ser Ql: NONREACTIVE

## 2022-06-06 ENCOUNTER — Other Ambulatory Visit (HOSPITAL_COMMUNITY): Payer: Self-pay

## 2022-06-06 ENCOUNTER — Other Ambulatory Visit: Payer: Self-pay

## 2022-06-06 DIAGNOSIS — B20 Human immunodeficiency virus [HIV] disease: Secondary | ICD-10-CM

## 2022-06-06 MED ORDER — DOVATO 50-300 MG PO TABS: 1.0000 | ORAL_TABLET | Freq: Every day | ORAL | 5 refills | Status: DC

## 2022-06-25 ENCOUNTER — Ambulatory Visit: Payer: Self-pay | Admitting: Infectious Disease

## 2022-06-25 NOTE — Progress Notes (Deleted)
Subjective:  Chief complaint   Patient ID: Cole Carr, male    DOB: [birth year], 36 y.o.   MRN: [id number]  HPI  Cole Carr is a 36 year old Black man with HIV, ? ADHD, hypertension who I have not seen in 3 years. His HMAP expired prior to my last visit and he did not renew. He says of his last bottle of Dovato a month ago.  Viral load was in few thousand copies.     Past Medical History:  Diagnosis Date   Dry skin 03/17/2017   HIV infection (HCC)    Hypertension    Visual acuity reduced 03/17/2017    No past surgical history on file.  Family History  Problem Relation Age of Onset   Hyperlipidemia Mother       Social History   Socioeconomic History   Marital status: Single    Spouse name: Not on file   Number of children: Not on file   Years of education: Not on file   Highest education level: Not on file  Occupational History   Not on file  Tobacco Use   Smoking status: Every Day    Packs/day: 0.50    Types: Cigarettes    Start date: 01/25/2014   Smokeless tobacco: Never  Substance and Sexual Activity   Alcohol use: Yes    Alcohol/week: 2.0 standard drinks of alcohol    Types: 2 Shots of liquor per week   Drug use: Not Currently    Types: Marijuana   Sexual activity: Not Currently    Partners: Female, Male    Birth control/protection: Condom    Comment: declined condoms  Other Topics Concern   Not on file  Social History Narrative   Not on file   Social Determinants of Health   Financial Resource Strain: Not on file  Food Insecurity: Not on file  Transportation Needs: Not on file  Physical Activity: Not on file  Stress: Not on file  Social Connections: Not on file    No Known Allergies   Current Outpatient Medications:    amLODipine (NORVASC) 10 MG tablet, Take 1 tablet (10 mg total) by mouth daily., Disp: 30 tablet, Rfl: 11   dolutegravir-lamiVUDine (DOVATO) 50-300 MG tablet, Take 1 tablet by mouth daily for 28 days., Disp: 28 tablet, Rfl:  0   dolutegravir-lamiVUDine (DOVATO) 50-300 MG tablet, Take 1 tablet by mouth daily., Disp: 30 tablet, Rfl: 5   Review of Systems  Constitutional:  Negative for activity change, appetite change, chills, diaphoresis, fatigue, fever and unexpected weight change.  HENT:  Negative for congestion, rhinorrhea, sinus pressure, sneezing, sore throat and trouble swallowing.   Eyes:  Negative for photophobia and visual disturbance.  Respiratory:  Negative for cough, chest tightness, shortness of breath, wheezing and stridor.   Cardiovascular:  Negative for chest pain, palpitations and leg swelling.  Gastrointestinal:  Negative for abdominal distention, abdominal pain, anal bleeding, blood in stool, constipation, diarrhea, nausea and vomiting.  Genitourinary:  Negative for difficulty urinating, dysuria, flank pain and hematuria.  Musculoskeletal:  Positive for arthralgias and back pain. Negative for gait problem, joint swelling and myalgias.  Skin:  Negative for color change, pallor, rash and wound.  Neurological:  Negative for dizziness, tremors, weakness, light-headedness and headaches.  Hematological:  Negative for adenopathy. Does not bruise/bleed easily.  Psychiatric/Behavioral:  Positive for sleep disturbance. Negative for agitation, behavioral problems, confusion, decreased concentration, dysphoric mood and suicidal ideas.        Objective:  Physical Exam Constitutional:      Appearance: He is well-developed.  HENT:     Head: Normocephalic and atraumatic.  Eyes:     Conjunctiva/sclera: Conjunctivae normal.  Cardiovascular:     Rate and Rhythm: Normal rate and regular rhythm.  Pulmonary:     Effort: Pulmonary effort is normal. No respiratory distress.     Breath sounds: No wheezing.  Abdominal:     General: There is no distension.     Palpations: Abdomen is soft.  Musculoskeletal:        General: No tenderness. Normal range of motion.     Cervical back: Normal range of motion and  neck supple.  Skin:    General: Skin is warm and dry.     Coloration: Skin is not pale.     Findings: No erythema or rash.  Neurological:     General: No focal deficit present.     Mental Status: He is alert and oriented to person, place, and time.  Psychiatric:        Mood and Affect: Mood normal.        Behavior: Behavior normal.        Thought Content: Thought content normal.        Judgment: Judgment normal.          Assessment & Plan:   HIV disease:  I will add order HIV viral load CD4 count CBC with differential CMP, RPR GC and chlamydia and I will continue  [facility] prescription and I gave him a bottle of 28 pills from samples  STI screen for gonorrhea chlamydia urine oropharynx and rectum.  ADHD: We will refer to primary care.  Hypertension we will renew his amlodipine prescription and refer to primary care.  Neck pain back pain with what sounds like neuropathic pain and medial nerve distribution:  Ideally should have an MRI of the C-spine performed potentially nerve conduction studies.  We will need to obtain an ACA plan using PCAP or ICAP or acquire medicaid  Vaccine counseling: Mended flu and COVID vaccines which were given today

## 2022-06-30 NOTE — Progress Notes (Deleted)
   Subjective:  Chief complaint   Patient ID: Cole Carr, male    DOB: September 30, 1985, 36 y.o.   MRN: [id number]  HPI  Cole Carr is a 36 year old Black man with HIV, ? ADHD, hypertension who I have not seen in 3 years. His HMAP expired prior to my last visit and he did not renew. He says of his last bottle of Dovato a month ago.  Viral load was in few thousand copies.     Past Medical History:  Diagnosis Date   Dry skin 03/17/2017   HIV infection (HCC)    Hypertension    Visual acuity reduced 03/17/2017    No past surgical history on file.  Family History  Problem Relation Age of Onset   Hyperlipidemia Mother       Social History   Socioeconomic History   Marital status: Single    Spouse name: Not on file   Number of children: Not on file   Years of education: Not on file   Highest education level: Not on file  Occupational History   Not on file  Tobacco Use   Smoking status: Every Day    Packs/day: 0.50    Types: Cigarettes    Start date: 01/25/2014   Smokeless tobacco: Never  Substance and Sexual Activity   Alcohol use: Yes    Alcohol/week: 2.0 standard drinks of alcohol    Types: 2 Shots of liquor per week   Drug use: Not Currently    Types: Marijuana   Sexual activity: Not Currently    Partners: Female, Male    Birth control/protection: Condom    Comment: declined condoms  Other Topics Concern   Not on file  Social History Narrative   Not on file   Social Determinants of Health   Financial Resource Strain: Not on file  Food Insecurity: Not on file  Transportation Needs: Not on file  Physical Activity: Not on file  Stress: Not on file  Social Connections: Not on file    No Known Allergies   Current Outpatient Medications:    amLODipine (NORVASC) 10 MG tablet, Take 1 tablet (10 mg total) by mouth daily., Disp: 30 tablet, Rfl: 11   dolutegravir-lamiVUDine (DOVATO) 50-300 MG tablet, Take 1 tablet by mouth daily for 28 days., Disp: 28 tablet, Rfl:  0   dolutegravir-lamiVUDine (DOVATO) 50-300 MG tablet, Take 1 tablet by mouth daily., Disp: 30 tablet, Rfl: 5   Review of Systems     Objective:   Physical Exam        Assessment & Plan:   HIV disease:  I will add order HIV viral load CD4 count CBC with differential CMP, RPR GC and chlamydia and I will continue  [facility] prescription and I gave him a bottle of 28 pills from samples  STI screen for gonorrhea chlamydia urine oropharynx and rectum.  ADHD: We will refer to primary care.  Hypertension we will renew his amlodipine prescription and refer to primary care.  Neck pain back pain with what sounds like neuropathic pain and medial nerve distribution:  Ideally should have an MRI of the C-spine performed potentially nerve conduction studies.  We will need to obtain an ACA plan using PCAP or ICAP or acquire medicaid  Vaccine counseling: Mended flu and COVID vaccines which were given today

## 2022-07-02 ENCOUNTER — Ambulatory Visit: Payer: Self-pay | Admitting: Infectious Disease

## 2022-07-02 DIAGNOSIS — B20 Human immunodeficiency virus [HIV] disease: Secondary | ICD-10-CM

## 2022-07-02 DIAGNOSIS — I10 Essential (primary) hypertension: Secondary | ICD-10-CM

## 2022-09-09 ENCOUNTER — Other Ambulatory Visit (HOSPITAL_COMMUNITY): Payer: Self-pay

## 2022-09-09 ENCOUNTER — Telehealth: Payer: Self-pay

## 2022-09-09 DIAGNOSIS — B20 Human immunodeficiency virus [HIV] disease: Secondary | ICD-10-CM

## 2022-09-09 NOTE — Telephone Encounter (Signed)
Patient called with difficulty getting his Dovato covered. Patient has [place] Medicaid Management and can fill his prescription at [facility].   Called patient back to see if he would like to have it filled there and mailed to his home, no answer and voicemail full.   Beryle Flock, RN

## 2022-09-10 ENCOUNTER — Other Ambulatory Visit: Payer: Self-pay

## 2022-09-10 MED ORDER — DOVATO 50-300 MG PO TABS: 1.0000 | ORAL_TABLET | Freq: Every day | ORAL | 2 refills | Status: DC

## 2022-09-10 NOTE — Telephone Encounter (Signed)
Patient called office today regarding missed call. Informed patient that we could send prescription to [facility] and have it mailed to him if he would like. States that he lives in [place], [place] now and would prefer to have his medication filled locally.  Updated patient's pharmacy in chart.

## 2022-09-10 NOTE — Addendum Note (Signed)
Addended by: Leatrice Jewels on: 09/10/2022 11:27 AM   Modules accepted: Orders

## 2022-09-17 ENCOUNTER — Other Ambulatory Visit (HOSPITAL_COMMUNITY): Payer: Self-pay

## 2022-09-22 NOTE — Telephone Encounter (Signed)
Patient called stating that he is still having trouble getting his medication from the pharmacy.

## 2022-09-23 ENCOUNTER — Other Ambulatory Visit (HOSPITAL_COMMUNITY): Payer: Self-pay

## 2022-09-23 NOTE — Telephone Encounter (Signed)
If he is having so much trouble with getting his script he needs to fill at [facility] and we will deliver the medication to him ... His copay $0.00 .... 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

## 2022-09-23 NOTE — Telephone Encounter (Signed)
We can mail to Cole Carr

## 2022-12-18 NOTE — Progress Notes (Signed)
Subjective:  Chief complaints: No send several of his toes as well as a history of some left upper arm weakness.      Patient ID: Cole Carr, male    DOB: 06-27-1986, 37 y.o.   MRN: [id number]  HPI  Cole Carr is a 37 year old Black man with HIV, ? ADHD, hypertension   I had prescribed him amlodipine but he tells me that when he started taking it he felt that it did not improve his blood pressure and that he felt like it was making him urinate more frequently.  Tells me that he has some numbness in his 3 of his toes third through fifth digits on the right and also more proximally in that foot but not on the opposite foot.  He is concerned that this might be related to HIV because he went in googled numbness in the feet.  Certainly HIV can cause neuropathy but I do not think this is very typical finding for this.  I also do not feel he is a typical patient where we would see HIV neuropathy as I have not been seeing it much in patient so we promptly treat with antiretrovirals.  He has also had a history of left-sided arm weakness.  He is concerned about that as well he had 2 episodes 1 of which he was taking cocaine.  Is not clear to me if these episodes were related to uncontrolled hypertension and potential TIA but that would concern me.  He also has had worsening of his vision recently he has seen an optometrist but not an ophthalmologist.    Past Medical History:  Diagnosis Date   Dry skin 03/17/2017   HIV infection (HCC)    Hypertension    Visual acuity reduced 03/17/2017    No past surgical history on file.  Family History  Problem Relation Age of Onset   Hyperlipidemia Mother       Social History   Socioeconomic History   Marital status: Single    Spouse name: Not on file   Number of children: Not on file   Years of education: Not on file   Highest education level: Not on file  Occupational History   Not on file  Tobacco Use   Smoking status: Every Day     Packs/day: .5    Types: Cigarettes    Start date: 01/25/2014   Smokeless tobacco: Never  Substance and Sexual Activity   Alcohol use: Yes    Alcohol/week: 2.0 standard drinks of alcohol    Types: 2 Shots of liquor per week   Drug use: Not Currently    Types: Marijuana   Sexual activity: Not Currently    Partners: Female, Male    Birth control/protection: Condom    Comment: declined condoms  Other Topics Concern   Not on file  Social History Narrative   Not on file   Social Determinants of Health   Financial Resource Strain: Not on file  Food Insecurity: Not on file  Transportation Needs: Not on file  Physical Activity: Not on file  Stress: Not on file  Social Connections: Not on file    No Known Allergies   Current Outpatient Medications:    amLODipine (NORVASC) 10 MG tablet, Take 1 tablet (10 mg total) by mouth daily., Disp: 30 tablet, Rfl: 11   dolutegravir-lamiVUDine (DOVATO) 50-300 MG tablet, Take 1 tablet by mouth daily., Disp: 30 tablet, Rfl: 2   Review of Systems  Constitutional:  Negative for  activity change, appetite change, chills, diaphoresis, fatigue, fever and unexpected weight change.  HENT:  Negative for congestion, rhinorrhea, sinus pressure, sneezing, sore throat and trouble swallowing.   Eyes:  Negative for photophobia and visual disturbance.  Respiratory:  Negative for cough, chest tightness, shortness of breath, wheezing and stridor.   Cardiovascular:  Negative for chest pain, palpitations and leg swelling.  Gastrointestinal:  Negative for abdominal distention, abdominal pain, anal bleeding, blood in stool, constipation, diarrhea, nausea and vomiting.  Genitourinary:  Negative for difficulty urinating, dysuria, flank pain and hematuria.  Musculoskeletal:  Negative for arthralgias, back pain, gait problem, joint swelling and myalgias.  Skin:  Negative for color change, pallor, rash and wound.  Neurological:  Negative for dizziness, tremors, weakness  and light-headedness.  Hematological:  Negative for adenopathy. Does not bruise/bleed easily.  Psychiatric/Behavioral:  Negative for agitation, behavioral problems, confusion, decreased concentration, dysphoric mood and sleep disturbance.        Objective:   Physical Exam Exam conducted with a chaperone present.  Constitutional:      Appearance: He is well-developed.  HENT:     Head: Normocephalic and atraumatic.  Eyes:     Conjunctiva/sclera: Conjunctivae normal.  Cardiovascular:     Rate and Rhythm: Normal rate and regular rhythm.  Pulmonary:     Effort: Pulmonary effort is normal. No respiratory distress.     Breath sounds: No wheezing.  Abdominal:     General: There is no distension.     Palpations: Abdomen is soft.  Genitourinary:    Rectum: No anal fissure or external hemorrhoid.     Comments: Anal pap smear obtained and screen for GC and chlamydia obtained Musculoskeletal:        General: No tenderness. Normal range of motion.     Cervical back: Normal range of motion and neck supple.  Skin:    General: Skin is warm and dry.     Coloration: Skin is not pale.     Findings: No erythema or rash.  Neurological:     General: No focal deficit present.     Mental Status: He is alert and oriented to person, place, and time.  Psychiatric:        Mood and Affect: Mood normal.        Behavior: Behavior normal.        Thought Content: Thought content normal.        Judgment: Judgment normal.           Assessment & Plan:   HIV disease:  I will add order HIV viral load CD4 count CBC with differential CMP, RPR GC and chlamydia and I will continue  [facility]  Dovato  prescription  Been living in [place] would like to continue coming here for his care  Hypertension:  Poorly controlled but not on amlodipine  He is agreeable to trying amlodipine again but with a higher dose.  Certainly other drugs such as lisinopril or the beta-blocker could be  used.  Episode of left arm weakness.  He also had mentioned neck pain and what sounded nerve like neuropathic pain in the past and I had contemplated getting an MRI of the neck.  At present I have more concerns that this could have represented a TIA in the context of uncontrolled hypertension and 1 situation cocaine use.  I will refer him to neurology though he does live in [place]  History of headaches: He also has intermittent headaches that improved after he smokes marijuana.  I will also refer him to neurology for this  Revision: I have referred to ophthalmology in particular in the context of his uncontrolled hypertension  Numbness in right foot: would expect this is due to trauma it does nto appear c/w a classic apathy: That being said I will screen him for diabetes mellitus and check his thyroid function.  Screening has been screened for gonorrhea chlamydia and rectum oropharynx and urine.  We are checking a serum RPR.  Screening for anal cancer we performed an anal Pap smear today.  I have personally spent 42 minutes involved in face-to-face and non-face-to-face activities for this patient on the day of the visit. Professional time spent includes the following activities: Preparing to see the patient (review of tests), Obtaining and/or reviewing separately obtained history (admission/discharge record), Performing a medically appropriate examination and/or evaluation , Ordering medications/tests/procedures, referring and communicating with other health care professionals, Documenting clinical information in the EMR, Independently interpreting results (not separately reported), Communicating results to the patient/family/caregiver, Counseling and educating the patient/family/caregiver and Care coordination (not separately reported).

## 2022-12-19 ENCOUNTER — Encounter: Payer: Self-pay | Admitting: Infectious Disease

## 2022-12-19 ENCOUNTER — Other Ambulatory Visit (HOSPITAL_COMMUNITY)
Admission: RE | Admit: 2022-12-19 | Discharge: 2022-12-19 | Disposition: A | Discharge status: Home / Self Care | Payer: Medicaid Other | Source: Ambulatory Visit | Attending: Infectious Disease | Admitting: Infectious Disease

## 2022-12-19 ENCOUNTER — Ambulatory Visit (INDEPENDENT_AMBULATORY_CARE_PROVIDER_SITE_OTHER): Payer: Medicaid Other | Admitting: Infectious Disease

## 2022-12-19 ENCOUNTER — Other Ambulatory Visit: Payer: Self-pay

## 2022-12-19 ENCOUNTER — Encounter: Payer: Self-pay | Admitting: Neurology

## 2022-12-19 VITALS — BP 159/112 | HR 78 | Temp 96.5°F | Ht 73.0 in | Wt 218.0 lb

## 2022-12-19 DIAGNOSIS — H538 Other visual disturbances: Secondary | ICD-10-CM | POA: Diagnosis not present

## 2022-12-19 DIAGNOSIS — B351 Tinea unguium: Secondary | ICD-10-CM | POA: Diagnosis not present

## 2022-12-19 DIAGNOSIS — G629 Polyneuropathy, unspecified: Secondary | ICD-10-CM

## 2022-12-19 DIAGNOSIS — B20 Human immunodeficiency virus [HIV] disease: Secondary | ICD-10-CM

## 2022-12-19 DIAGNOSIS — R29898 Other symptoms and signs involving the musculoskeletal system: Secondary | ICD-10-CM

## 2022-12-19 DIAGNOSIS — I10 Essential (primary) hypertension: Secondary | ICD-10-CM

## 2022-12-19 MED ORDER — AMLODIPINE BESYLATE 10 MG PO TABS: 10.0000 mg | ORAL_TABLET | Freq: Every day | ORAL | 11 refills | Status: AC

## 2022-12-19 MED ORDER — DOVATO 50-300 MG PO TABS
1.0000 | ORAL_TABLET | Freq: Every day | ORAL | 11 refills | Status: DC
Start: 2022-12-19 — End: 2023-12-22

## 2022-12-20 ENCOUNTER — Other Ambulatory Visit: Payer: Self-pay | Admitting: Infectious Disease

## 2022-12-20 LAB — HEMOGLOBIN A1C: Hgb A1c MFr Bld: 9.9 % of total Hgb — ABNORMAL HIGH (ref <5.7)

## 2022-12-20 MED ORDER — METFORMIN HCL 500 MG PO TABS: 500.0000 mg | ORAL_TABLET | Freq: Two times a day (BID) | ORAL | 4 refills | Status: AC

## 2022-12-20 MED ORDER — ROSUVASTATIN CALCIUM 10 MG PO TABS: 10.0000 mg | ORAL_TABLET | Freq: Every day | ORAL | 11 refills | Status: AC

## 2022-12-23 ENCOUNTER — Telehealth: Payer: Self-pay

## 2022-12-23 LAB — RPR: RPR Ser Ql: NONREACTIVE

## 2022-12-23 LAB — COMPLETE METABOLIC PANEL WITH GFR
AG Ratio: 1.7 (calc) (ref 1.0–2.5)
ALT: 49 U/L — ABNORMAL HIGH (ref 9–46)
AST: 30 U/L (ref 10–40)
Albumin: 4.7 g/dL (ref 3.6–5.1)
Alkaline phosphatase (APISO): 68 U/L (ref 36–130)
BUN/Creatinine Ratio: 6 (calc) (ref 6–22)
BUN: 6 mg/dL — ABNORMAL LOW (ref 7–25)
CO2: 28 mmol/L (ref 20–32)
Calcium: 9.5 mg/dL (ref 8.6–10.3)
Chloride: 98 mmol/L (ref 98–110)
Creat: 0.93 mg/dL (ref 0.60–1.26)
Globulin: 2.8 g/dL (calc) (ref 1.9–3.7)
Glucose, Bld: 366 mg/dL — ABNORMAL HIGH (ref 65–99)
Potassium: 3.7 mmol/L (ref 3.5–5.3)
Sodium: 137 mmol/L (ref 135–146)
Total Bilirubin: 0.6 mg/dL (ref 0.2–1.2)
Total Protein: 7.5 g/dL (ref 6.1–8.1)
eGFR: 108 mL/min/{1.73_m2} (ref >=60)

## 2022-12-23 LAB — CYTOLOGY, (ORAL, ANAL, URETHRAL) ANCILLARY ONLY
Chlamydia: NEGATIVE
Chlamydia: NEGATIVE
Comment: NEGATIVE
Comment: NEGATIVE
Comment: NORMAL
Comment: NORMAL
Neisseria Gonorrhea: NEGATIVE
Neisseria Gonorrhea: NEGATIVE

## 2022-12-23 LAB — CBC WITH DIFFERENTIAL/PLATELET
Absolute Monocytes: 279 cells/uL (ref 200–950)
Basophils Absolute: 10 cells/uL (ref 0–200)
Basophils Relative: 0.2 %
Eosinophils Absolute: 142 cells/uL (ref 15–500)
Eosinophils Relative: 2.9 %
HCT: 43 % (ref 38.5–50.0)
Hemoglobin: 14.2 g/dL (ref 13.2–17.1)
Lymphs Abs: 2411 cells/uL (ref 850–3900)
MCH: 28.4 pg (ref 27.0–33.0)
MCHC: 33 g/dL (ref 32.0–36.0)
MCV: 86 fL (ref 80.0–100.0)
MPV: 10.5 fL (ref 7.5–12.5)
Monocytes Relative: 5.7 %
Neutro Abs: 2058 cells/uL (ref 1500–7800)
Neutrophils Relative %: 42 %
Platelets: 211 10*3/uL (ref 140–400)
RBC: 5 10*6/uL (ref 4.20–5.80)
RDW: 12.7 % (ref 11.0–15.0)
Total Lymphocyte: 49.2 %
WBC: 4.9 10*3/uL (ref 3.8–10.8)

## 2022-12-23 LAB — T-HELPER CELLS (CD4) COUNT (NOT AT ARMC)
Absolute CD4: 979 cells/uL (ref 490–1740)
CD4 T Helper %: 41 % (ref 30–61)
Total lymphocyte count: 2409 cells/uL (ref 850–3900)

## 2022-12-23 LAB — HEMOGLOBIN A1C
Mean Plasma Glucose: 237 mg/dL
eAG (mmol/L): 13.2 mmol/L

## 2022-12-23 LAB — LIPID PANEL
Cholesterol: 196 mg/dL (ref <200)
HDL: 48 mg/dL (ref >=40)
LDL Cholesterol (Calc): 114 mg/dL (calc) — ABNORMAL HIGH
Non-HDL Cholesterol (Calc): 148 mg/dL (calc) — ABNORMAL HIGH (ref <130)
Total CHOL/HDL Ratio: 4.1 (calc) (ref <5.0)
Triglycerides: 218 mg/dL — ABNORMAL HIGH (ref <150)

## 2022-12-23 LAB — TSH+FREE T4: TSH W/REFLEX TO FT4: 1.57 mIU/L (ref 0.40–4.50)

## 2022-12-23 LAB — HIV RNA, RTPCR W/R GT (RTI, PI,INT)
HIV 1 RNA Quant: NOT DETECTED copies/mL
HIV-1 RNA Quant, Log: NOT DETECTED Log copies/mL

## 2022-12-23 LAB — URINE CYTOLOGY ANCILLARY ONLY
Chlamydia: NEGATIVE
Comment: NEGATIVE
Comment: NORMAL
Neisseria Gonorrhea: NEGATIVE

## 2022-12-23 LAB — HEPATITIS C AB W/RFL RNA, PCR + GENO: Hepatitis C Ab: NONREACTIVE

## 2022-12-23 NOTE — Telephone Encounter (Signed)
-----   Message from Randall Hiss, MD sent at 12/20/2022  2:22 PM EDT ----- Patient in fact DOES have DM, I will start metformin (sending to his pharmacy) but he should be engaged with a PCP ----- Message ----- From: Leory Plowman, [facility] Lab Results In Sent: 12/19/2022  11:03 PM EDT To: Randall Hiss, MD

## 2022-12-23 NOTE — Telephone Encounter (Signed)
Attempted to call patient regarding lab results. Not able to reach him at this time. Will need to inform patient that besides having DM his HIV is not well-controlled currently with a viral load of nearly 2000 and it looks as if resistance testing has been sent but I want to make sure that he is taking his medications consistently and not missing doses per MD.  Will send mychart message requesting  call back.  Voicemail is full and not accepting new messages. Juanita Laster, RMA

## 2022-12-24 NOTE — Telephone Encounter (Signed)
Second attempt to contact patient - VM box full, unable to leave a message.

## 2022-12-25 LAB — CYTOLOGY - PAP: Diagnosis: NEGATIVE

## 2022-12-25 NOTE — Telephone Encounter (Signed)
Third attempt to reach patient regarding labs.

## 2022-12-29 NOTE — Telephone Encounter (Signed)
Fourth attempt to reach patient. Sending letter to address on file requesting call back.   Sandie Ano, RN

## 2023-02-02 ENCOUNTER — Encounter: Payer: Self-pay | Admitting: Neurology

## 2023-02-02 ENCOUNTER — Ambulatory Visit: Payer: Medicaid Other | Admitting: Neurology

## 2023-12-21 ENCOUNTER — Other Ambulatory Visit: Payer: Self-pay | Admitting: Infectious Disease

## 2023-12-21 DIAGNOSIS — B20 Human immunodeficiency virus [HIV] disease: Secondary | ICD-10-CM

## 2024-01-27 ENCOUNTER — Other Ambulatory Visit: Payer: Self-pay | Admitting: Infectious Disease

## 2024-01-27 DIAGNOSIS — B20 Human immunodeficiency virus [HIV] disease: Secondary | ICD-10-CM
